# Patient Record
Sex: Female | Born: 1995
Health system: Southern US, Community
[De-identification: ages and names within clinical notes are randomized; demographics above are authoritative.]

## PROBLEM LIST (undated history)

## (undated) ENCOUNTER — Inpatient Hospital Stay (HOSPITAL_COMMUNITY): Payer: Self-pay

## (undated) DIAGNOSIS — Z789 Other specified health status: Secondary | ICD-10-CM

## (undated) DIAGNOSIS — J302 Other seasonal allergic rhinitis: Secondary | ICD-10-CM

## (undated) DIAGNOSIS — K649 Unspecified hemorrhoids: Secondary | ICD-10-CM

## (undated) HISTORY — DX: Unspecified hemorrhoids: K64.9

## (undated) HISTORY — PX: WISDOM TOOTH EXTRACTION: SHX21

---
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
## 2014-05-29 DIAGNOSIS — M2391 Unspecified internal derangement of right knee: Secondary | ICD-10-CM | POA: Insufficient documentation

## 2014-05-29 DIAGNOSIS — M25561 Pain in right knee: Secondary | ICD-10-CM | POA: Insufficient documentation

## 2014-05-29 DIAGNOSIS — M222X9 Patellofemoral disorders, unspecified knee: Secondary | ICD-10-CM | POA: Insufficient documentation

## 2014-08-13 ENCOUNTER — Emergency Department
Admission: EM | Admit: 2014-08-13 | Discharge: 2014-08-13 | Disposition: A | Discharge status: Home / Self Care | Payer: Medicaid Other | Source: Home / Self Care | Attending: Family Medicine | Admitting: Family Medicine

## 2014-08-13 ENCOUNTER — Encounter: Payer: Self-pay | Admitting: *Deleted

## 2014-08-13 DIAGNOSIS — J029 Acute pharyngitis, unspecified: Secondary | ICD-10-CM

## 2014-08-13 DIAGNOSIS — B9789 Other viral agents as the cause of diseases classified elsewhere: Secondary | ICD-10-CM

## 2014-08-13 DIAGNOSIS — J069 Acute upper respiratory infection, unspecified: Secondary | ICD-10-CM

## 2014-08-13 LAB — POCT INFLUENZA A/B
INFLUENZA A, POC: NEGATIVE
Influenza B, POC: NEGATIVE

## 2014-08-13 LAB — POCT RAPID STREP A (OFFICE): RAPID STREP A SCREEN: NEGATIVE

## 2014-08-13 MED ORDER — AZITHROMYCIN 250 MG PO TABS: ORAL_TABLET | ORAL | Status: DC

## 2014-08-13 MED ORDER — BENZONATATE 200 MG PO CAPS: 200.0000 mg | ORAL_CAPSULE | Freq: Every day | ORAL | Status: DC

## 2014-08-13 NOTE — ED Provider Notes (Signed)
CSN: [id number]     Arrival date & time 08/13/14  1248 History   First MD Initiated Contact with Patient 08/13/14 1315     Chief Complaint  Patient presents with  . Generalized Body Aches  . Cough      HPI Comments: Patient developed a sore throat yesterday evening, with fatigue, headache, myalgias, and chills/sweats.  She later developed a cough.  This morning she noted sinus congestion and tightness in her anterior chest.  The history is provided by the patient.    History reviewed. No pertinent past medical history. History reviewed. No pertinent past surgical history. History reviewed. No pertinent family history. History  Substance Use Topics  . Smoking status: Never Smoker   . Smokeless tobacco: Never Used  . Alcohol Use: No   OB History    No data available     Review of Systems + sore throat + hoarseness + cough No pleuritic pain but complains of anterior chest tightness No wheezing + nasal congestion + post-nasal drainage No sinus pain/pressure No itchy/red eyes No earache No hemoptysis No SOB No fever, + chills/sweats No nausea No vomiting No abdominal pain No diarrhea No urinary symptoms No skin rash + fatigue + myalgias + headache Used OTC meds without relief  Allergies  Review of patient's allergies indicates no known allergies.  Home Medications   Prior to Admission medications   Medication Sig Start Date End Date Taking? Authorizing Provider  azithromycin (ZITHROMAX Z-PAK) 250 MG tablet Take 2 tabs today; then begin one tab once daily for 4 more days. (Rx void after 08/21/14) 08/13/14   Lattie HawStephen A Luzmaria Devaux, MD  benzonatate (TESSALON) 200 MG capsule Take 1 capsule (200 mg total) by mouth at bedtime. Take as needed for cough 08/13/14   Lattie HawStephen A Muaz Shorey, MD   BP 113/80 mmHg  Pulse 112  Temp(Src) 99 F (37.2 C) (Oral)  Resp 16  Ht 5\' 1"  (1.549 m)  Wt 127 lb (57.607 kg)  BMI 24.01 kg/m2  SpO2 98%  LMP 07/14/2014 Physical Exam Nursing notes and  Vital Signs reviewed. Appearance:  Patient appears healthy, stated age, and in no acute distress Eyes:  Pupils are equal, round, and reactive to light and accomodation.  Extraocular movement is intact.  Conjunctivae are not inflamed  Ears:  Canals normal.  Tympanic membranes normal.  Nose:  Mildly congested turbinates.  No sinus tenderness.   Pharynx:   Minimal erythema Neck:  Supple.   Tender enlarged anterior/posterior nodes are palpated bilaterally  Lungs:  Clear to auscultation.  Breath sounds are equal.  Heart:  Regular rate and rhythm without murmurs, rubs, or gallops.  Abdomen:  Nontender without masses or hepatosplenomegaly.  Bowel sounds are present.  No CVA or flank tenderness.  Extremities:  No edema.  No calf tenderness Skin:  No rash present.   ED Course  Procedures  None    Labs Reviewed  STREP A DNA PROBE  POCT INFLUENZA A/B negative  POCT RAPID STREP A (OFFICE) negative     MDM   1. Acute pharyngitis, unspecified pharyngitis type   2. Viral URI with cough    There is no evidence of bacterial infection today.  Throat culture pending. Prescription written for Benzonatate [facility](Tessalon) to take at bedtime for night-time cough.   Take plain Mucinex (1200 mg guaifenesin) twice daily for cough and congestion.  May add Sudafed for sinus congestion.   Increase fluid intake, rest. May use Afrin nasal spray (or generic oxymetazoline) twice daily  for about 5 days.  Also recommend using saline nasal spray several times daily and saline nasal irrigation (AYR is a common brand) Try warm salt water gargles for sore throat.  Stop all antihistamines for now, and other non-prescription cough/cold preparations. May take Ibuprofen 200mg , 4 tabs every 8 hours with food for sore throat, fever, etc. Begin Azithromycin if not improving about one week or if persistent fever develops (Given a prescription to hold, with an expiration date)  Follow-up with family doctor if not improving about10  days.    Lattie HawStephen A Tysean Vandervliet, MD 08/16/14 [phone]

## 2014-08-13 NOTE — Discharge Instructions (Signed)
Take plain Mucinex (1200 mg guaifenesin) twice daily for cough and congestion.  May add Sudafed for sinus congestion.   Increase fluid intake, rest. May use Afrin nasal spray (or generic oxymetazoline) twice daily for about 5 days.  Also recommend using saline nasal spray several times daily and saline nasal irrigation (AYR is a common brand) Try warm salt water gargles for sore throat.  Stop all antihistamines for now, and other non-prescription cough/cold preparations. May take Ibuprofen 200mg , 4 tabs every 8 hours with food for sore throat, fever, etc. Begin Azithromycin if not improving about one week or if persistent fever develops (Given a prescription to hold, with an expiration date)  Follow-up with family doctor if not improving about10 days.

## 2014-08-13 NOTE — ED Notes (Signed)
Patricia Cole c/o rapid onset body aches, dry cough, sweats and HA x yesterday. Rec'd flu vac this season.

## 2014-08-14 LAB — STREP A DNA PROBE: GASP: NEGATIVE

## 2014-08-15 ENCOUNTER — Telehealth: Payer: Self-pay | Admitting: Emergency Medicine

## 2015-01-03 ENCOUNTER — Emergency Department
Admission: EM | Admit: 2015-01-03 | Discharge: 2015-01-03 | Disposition: A | Discharge status: Home / Self Care | Payer: Medicaid Other | Source: Home / Self Care | Attending: Family Medicine | Admitting: Family Medicine

## 2015-01-03 ENCOUNTER — Encounter: Payer: Self-pay | Admitting: Emergency Medicine

## 2015-01-03 DIAGNOSIS — J069 Acute upper respiratory infection, unspecified: Secondary | ICD-10-CM

## 2015-01-03 DIAGNOSIS — B9789 Other viral agents as the cause of diseases classified elsewhere: Principal | ICD-10-CM

## 2015-01-03 HISTORY — DX: Other seasonal allergic rhinitis: J30.2

## 2015-01-03 LAB — POCT RAPID STREP A (OFFICE): Rapid Strep A Screen: NEGATIVE

## 2015-01-03 MED ORDER — AZITHROMYCIN 250 MG PO TABS: ORAL_TABLET | ORAL | Status: DC

## 2015-01-03 MED ORDER — BENZONATATE 200 MG PO CAPS: 200.0000 mg | ORAL_CAPSULE | Freq: Every day | ORAL | Status: DC

## 2015-01-03 NOTE — Discharge Instructions (Signed)
Take plain guaifenesin (1200mg  extended release tabs such as Mucinex) twice daily, with plenty of water, for cough and congestion.  May add Pseudoephedrine (30mg , one or two every 4 to 6 hours) for sinus congestion.  Get adequate rest.   May use Afrin nasal spray (or generic oxymetazoline) twice daily for about 5 days.  Also recommend using saline nasal spray several times daily and saline nasal irrigation (AYR is a common brand).  Try warm salt water gargles for sore throat.  Stop all antihistamines for now, and other non-prescription cough/cold preparations. May take Ibuprofen 200mg , 4 tabs every 8 hours with food for body aches, headache, etc. Begin Azithromycin if not improving about one week or if persistent fever develops   Follow-up with family doctor if not improving about10 days.

## 2015-01-03 NOTE — ED Provider Notes (Signed)
CSN: [id number]     Arrival date & time 01/03/15  1033 History   First MD Initiated Contact with Patient 01/03/15 1053     Chief Complaint  Patient presents with  . Facial Pain      HPI Comments: Patient complains of three day history of typical cold-like symptoms including mild sore throat, sinus congestion, headache, fatigue, and cough.   The history is provided by the patient.    Past Medical History  Diagnosis Date  . Seasonal allergies    History reviewed. No pertinent past surgical history. History reviewed. No pertinent family history. History  Substance Use Topics  . Smoking status: Never Smoker   . Smokeless tobacco: Never Used  . Alcohol Use: No   OB History    No data available     Review of Systems + sore throat + hoarse + cough No pleuritic pain No wheezing + nasal congestion + post-nasal drainage No sinus pain/pressure No itchy/red eyes No earache No hemoptysis No SOB No fever, + chills/night sweats No nausea No vomiting No abdominal pain No diarrhea No urinary symptoms No skin rash + fatigue + myalgias + headache Used OTC meds without relief  Allergies  Review of patient's allergies indicates no known allergies.  Home Medications   Prior to Admission medications   Medication Sig Start Date End Date Taking? Authorizing Provider  azithromycin (ZITHROMAX Z-PAK) 250 MG tablet Take 2 tabs today; then begin one tab once daily for 4 more days. (Rx void after 01/11/15) 01/03/15   Lattie HawStephen A Ben Habermann, MD  benzonatate (TESSALON) 200 MG capsule Take 1 capsule (200 mg total) by mouth at bedtime. Take as needed for cough 01/03/15   Lattie HawStephen A Mayzee Reichenbach, MD   BP 111/73 mmHg  Pulse 97  Temp(Src) 98.4 F (36.9 C) (Oral)  Resp 16  Ht 5\' 1"  (1.549 m)  Wt 124 lb (56.246 kg)  BMI 23.44 kg/m2  SpO2 99%  LMP 12/14/2014 (Exact Date) Physical Exam Nursing notes and Vital Signs reviewed. Appearance:  Patient appears stated age, and in no acute distress Eyes:   Pupils are equal, round, and reactive to light and accomodation.  Extraocular movement is intact.  Conjunctivae are not inflamed  Ears:  Canals normal.  Tympanic membranes normal.  Nose:  Mildly congested turbinates.  No sinus tenderness.  Pharynx:  Minimal erythema Neck:  Supple. Tender enlarged posterior nodes are palpated bilaterally  Lungs:  Clear to auscultation.  Breath sounds are equal.  Heart:  Regular rate and rhythm without murmurs, rubs, or gallops.  Abdomen:  Nontender without masses or hepatosplenomegaly.  Bowel sounds are present.  No CVA or flank tenderness.  Extremities:  No edema.  No calf tenderness Skin:  No rash present.   ED Course  Procedures none  Labs Reviewed  POCT RAPID STREP A (OFFICE) negative         MDM   1. Viral URI with cough    There is no evidence of bacterial infection today.  Treat symptomatically for now  Prescription written for Benzonatate (Tessalon) to take at bedtime for night-time cough.  Take plain guaifenesin (1200mg  extended release tabs such as Mucinex) twice daily, with plenty of water, for cough and congestion.  May add Pseudoephedrine (30mg , one or two every 4 to 6 hours) for sinus congestion.  Get adequate rest.   May use Afrin nasal spray (or generic oxymetazoline) twice daily for about 5 days.  Also recommend using saline nasal spray several times daily and saline  nasal irrigation (AYR is a common brand).  Try warm salt water gargles for sore throat.  Stop all antihistamines for now, and other non-prescription cough/cold preparations. May take Ibuprofen , 4 tabs every 8 hours with food for body aches, headache, etc. Begin Azithromycin if not improving about one week or if persistent fever develops (Given a prescription to hold, with an expiration date)  Follow-up with family doctor if not improving about10 days.    Lattie Haw, MD 01/08/15 [phone]

## 2015-01-03 NOTE — ED Notes (Signed)
Reports congestion with headache and pain across facial sinuses; minor sore throat and hoarseness. No OTCs today.

## 2015-12-16 DIAGNOSIS — B009 Herpesviral infection, unspecified: Secondary | ICD-10-CM | POA: Insufficient documentation

## 2017-08-30 DIAGNOSIS — Z3401 Encounter for supervision of normal first pregnancy, first trimester: Secondary | ICD-10-CM | POA: Diagnosis not present

## 2017-08-30 LAB — OB RESULTS CONSOLE ABO/RH: RH Type: POSITIVE

## 2017-08-30 LAB — OB RESULTS CONSOLE RUBELLA ANTIBODY, IGM: Rubella: IMMUNE

## 2017-08-30 LAB — OB RESULTS CONSOLE ANTIBODY SCREEN: ANTIBODY SCREEN: NEGATIVE

## 2017-08-30 LAB — OB RESULTS CONSOLE RPR: RPR: NONREACTIVE

## 2017-08-30 LAB — OB RESULTS CONSOLE HEPATITIS B SURFACE ANTIGEN: Hepatitis B Surface Ag: NEGATIVE

## 2017-08-30 LAB — OB RESULTS CONSOLE HIV ANTIBODY (ROUTINE TESTING): HIV: NONREACTIVE

## 2017-09-01 DIAGNOSIS — Z3401 Encounter for supervision of normal first pregnancy, first trimester: Secondary | ICD-10-CM | POA: Diagnosis not present

## 2017-09-01 DIAGNOSIS — O26899 Other specified pregnancy related conditions, unspecified trimester: Secondary | ICD-10-CM | POA: Diagnosis not present

## 2017-09-12 NOTE — L&D Delivery Note (Addendum)
Patient is 22 y.o. G1P0 4672w1d who delivered a viable newborn female via NSVD at [id number] on 04/23/18. Head delivered OA. Apgars 8/9. No nuchal cord present. Shoulder and body delivered in usual fashion. Infant with spontaneous cry, placed on mother's abdomen, dried and bulb suctioned. Cord clamped x 2 after 1-minute delay, and cut by family member. Cord blood drawn. Placenta delivered spontaneously with gentle cord traction. Fundus firm with massage and IV Pitocin. Perineum inspected and found to have 2nd degree laceration, which was repaired with 3-0 vicryl rapide.   Delivery information: Delivery Note At 11:28 PM a viable female was delivered via Vaginal, Spontaneous (Presentation:OA).  APGAR: 8, 9; weight 7 lb 10.6 oz (3476 g).   Placenta status: spontaneous; cord: 3V, likely villamentous  Anesthesia: epidural  Episiotomy: None Lacerations: 2nd degree Suture Repair: 3.0 vicryl rapide Est. Blood Loss (mL): 200  Mom to postpartum.  Baby to Couplet care / Skin to Skin.  Candis Schatzatricia Dell 04/24/2018, 1:05 AM  Midwife attestation: I was gloved and present for delivery in its entirety and I agree with the above resident's note.  Donette LarryMelanie Ty Buntrock, CNM 1:40 AM

## 2017-09-26 DIAGNOSIS — Z3401 Encounter for supervision of normal first pregnancy, first trimester: Secondary | ICD-10-CM | POA: Diagnosis not present

## 2017-10-13 ENCOUNTER — Emergency Department (HOSPITAL_BASED_OUTPATIENT_CLINIC_OR_DEPARTMENT_OTHER)
Admission: EM | Admit: 2017-10-13 | Discharge: 2017-10-13 | Disposition: A | Discharge status: Home / Self Care | Payer: 59 | Attending: Emergency Medicine | Admitting: Emergency Medicine

## 2017-10-13 ENCOUNTER — Encounter (HOSPITAL_BASED_OUTPATIENT_CLINIC_OR_DEPARTMENT_OTHER): Payer: Self-pay | Admitting: Emergency Medicine

## 2017-10-13 ENCOUNTER — Other Ambulatory Visit: Payer: Self-pay

## 2017-10-13 DIAGNOSIS — R61 Generalized hyperhidrosis: Secondary | ICD-10-CM | POA: Diagnosis not present

## 2017-10-13 DIAGNOSIS — Z79899 Other long term (current) drug therapy: Secondary | ICD-10-CM | POA: Diagnosis not present

## 2017-10-13 DIAGNOSIS — J3489 Other specified disorders of nose and nasal sinuses: Secondary | ICD-10-CM | POA: Insufficient documentation

## 2017-10-13 DIAGNOSIS — R070 Pain in throat: Secondary | ICD-10-CM | POA: Diagnosis not present

## 2017-10-13 DIAGNOSIS — R0981 Nasal congestion: Secondary | ICD-10-CM | POA: Diagnosis not present

## 2017-10-13 DIAGNOSIS — J069 Acute upper respiratory infection, unspecified: Secondary | ICD-10-CM | POA: Insufficient documentation

## 2017-10-13 DIAGNOSIS — R05 Cough: Secondary | ICD-10-CM | POA: Diagnosis not present

## 2017-10-13 DIAGNOSIS — B9789 Other viral agents as the cause of diseases classified elsewhere: Secondary | ICD-10-CM | POA: Insufficient documentation

## 2017-10-13 NOTE — Discharge Instructions (Signed)
Can use robitussin for cold symptoms and tylenol as needed.  Also can use sinus rinse or netty pot

## 2017-10-13 NOTE — ED Provider Notes (Signed)
[facility] EMERGENCY DEPARTMENT Provider Note   CSN: [id number] Arrival date & time: 10/13/17  1542     History   Chief Complaint Chief Complaint  Patient presents with  . Cough    HPI Patricia Cole is a 22 y.o. female.  The history is provided by the patient.  URI   This is a new problem. Episode onset: 3 days ago. The problem has not changed since onset.There has been no fever. Associated symptoms include congestion, rhinorrhea, sinus pain, sore throat and cough. Pertinent negatives include no rash and no wheezing. Associated symptoms comments: sweats. Treatments tried: tylenol. The treatment provided no relief.    Past Medical History:  Diagnosis Date  . Seasonal allergies     Patient Active Problem List   Diagnosis Date Noted  . URI 09/20/2009  . FEVER UNSPECIFIED 09/20/2009  . COUGH 09/20/2009  . CONTUSION OF BACK 05/21/2009  . Acute pharyngitis 03/29/2009    History reviewed. No pertinent surgical history.  OB History    Gravida Para Term Preterm AB Living   1             SAB TAB Ectopic Multiple Live Births                   Home Medications    Prior to Admission medications   Medication Sig Start Date End Date Taking? Authorizing Provider  ondansetron (ZOFRAN) 4 MG tablet Take 4 mg by mouth every 8 (eight) hours as needed for nausea or vomiting.   Yes [provider]  Prenatal Vit-Fe Fumarate-FA (PRENATAL MULTIVITAMIN) TABS tablet Take 1 tablet by mouth daily at 12 noon.   Yes [provider]  azithromycin (ZITHROMAX Z-PAK) 250 MG tablet Take 2 tabs today; then begin one tab once daily for 4 more days. (Rx void after 01/11/15) 01/03/15   Lattie Haw, MD  benzonatate (TESSALON) 200 MG capsule Take 1 capsule (200 mg total) by mouth at bedtime. Take as needed for cough 01/03/15   Lattie Haw, MD    Family History History reviewed. No pertinent family history.  Social History Social History   Tobacco Use  .  Smoking status: Never Smoker  . Smokeless tobacco: Never Used  Substance Use Topics  . Alcohol use: No  . Drug use: No     Allergies   Patient has no known allergies.   Review of Systems Review of Systems  HENT: Positive for congestion, rhinorrhea, sinus pain and sore throat.   Respiratory: Positive for cough. Negative for wheezing.   Genitourinary:       Currently [redacted] weeks pregnant and still having some n/v but unchanged  Skin: Negative for rash.  All other systems reviewed and are negative.    Physical Exam Updated Vital Signs BP 132/79 (BP Location: Left Arm)   Pulse 96   Temp 98 F (36.7 C) (Oral)   Resp 18   Ht 5\' 1"  (1.549 m)   Wt 61.2 kg (135 lb)   SpO2 100%   BMI 25.51 kg/m   Physical Exam  Constitutional: She is oriented to person, place, and time. She appears well-developed and well-nourished. No distress.  HENT:  Head: Normocephalic and atraumatic.  Nose: Mucosal edema and rhinorrhea present.  Mouth/Throat: Posterior oropharyngeal erythema present. No oropharyngeal exudate or posterior oropharyngeal edema.  Bilateral cerumen impaction  Eyes: Conjunctivae and EOM are normal. Pupils are equal, round, and reactive to light.  Neck: Normal range of motion. Neck supple.  Cardiovascular: Normal rate, regular rhythm and intact distal pulses.  No murmur heard. Pulmonary/Chest: Effort normal and breath sounds normal. No respiratory distress. She has no wheezes. She has no rales.  Musculoskeletal: Normal range of motion. She exhibits no edema or tenderness.  Neurological: She is alert and oriented to person, place, and time.  Skin: Skin is warm and dry. No rash noted. No erythema.  Psychiatric: She has a normal mood and affect. Her behavior is normal.  Nursing note and vitals reviewed.    ED Treatments / Results  Labs (all labs ordered are listed, but only abnormal results are displayed) Labs Reviewed - No data to display  EKG  EKG Interpretation None         Radiology No results found.  Procedures Procedures (including critical care time)  Medications Ordered in ED Medications - No data to display   Initial Impression / Assessment and Plan / ED Course  I have reviewed the triage vital signs and the nursing notes.  Pertinent labs & imaging results that were available during my care of the patient were reviewed by me and considered in my medical decision making (see chart for details).     Pt with symptoms consistent with viral URI.  Well appearing here.  No signs of breathing difficulty  No signs of pharyngitis, otitis or abnormal abdominal findings.   Supportive care and pt to return with any further problems.   Final Clinical Impressions(s) / ED Diagnoses   Final diagnoses:  Viral URI with cough    ED Discharge Orders    None       Gwyneth SproutPlunkett, Joscelyne Renville, MD 10/13/17 1746

## 2017-10-13 NOTE — ED Triage Notes (Signed)
Patient reports " I want to make sure I don't have the flu".  Reports sore throat, "feeling sweaty", cough, body aches x 3 days.  Denies fever

## 2017-10-13 NOTE — ED Notes (Signed)
ED Provider at bedside. 

## 2017-10-16 ENCOUNTER — Other Ambulatory Visit: Payer: Self-pay

## 2017-10-16 ENCOUNTER — Emergency Department (INDEPENDENT_AMBULATORY_CARE_PROVIDER_SITE_OTHER)
Admission: EM | Admit: 2017-10-16 | Discharge: 2017-10-16 | Disposition: A | Discharge status: Home / Self Care | Payer: 59 | Source: Home / Self Care | Attending: Family Medicine | Admitting: Family Medicine

## 2017-10-16 DIAGNOSIS — O219 Vomiting of pregnancy, unspecified: Secondary | ICD-10-CM

## 2017-10-16 DIAGNOSIS — J029 Acute pharyngitis, unspecified: Secondary | ICD-10-CM

## 2017-10-16 DIAGNOSIS — J069 Acute upper respiratory infection, unspecified: Secondary | ICD-10-CM | POA: Diagnosis not present

## 2017-10-16 MED ORDER — AMOXICILLIN 500 MG PO CAPS: 500.0000 mg | ORAL_CAPSULE | Freq: Three times a day (TID) | ORAL | 0 refills | Status: DC

## 2017-10-16 NOTE — ED Provider Notes (Signed)
[facility]    CSN: [id number] Arrival date & time: 10/16/17  1347     History   Chief Complaint Chief Complaint  Patient presents with  . Cough  . Sore Throat  . Fever  . Nasal Congestion    HPI Patricia Cole is a 22 y.o. female.   HPI Patricia Cole is a 22 y.o. female presenting to UC with c/o 1 week of worsening cough, congestion, HA, fatigue, nausea vomiting and low grade fever.  She was [facility] at [facility] ago and was advised symptoms were viral in nature. She has been taking Tylenol every 4 hours with mild temporary relief.  Denies chest pain or SOB.  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  Pt is [redacted] weeks pregnant but states that because occasional nausea and vomiting, her pregnancy is going well. She did try Zofran as prescribed by her OB/GYN but no relief.    Past Medical History:  Diagnosis Date  . Seasonal allergies     Patient Active Problem List   Diagnosis Date Noted  . URI 09/20/2009  . FEVER UNSPECIFIED 09/20/2009  . COUGH 09/20/2009  . CONTUSION OF BACK 05/21/2009  . Acute pharyngitis 03/29/2009    History reviewed. No pertinent surgical history.  OB History    Gravida Para Term Preterm AB Living   1             SAB TAB Ectopic Multiple Live Births                   Home Medications    Prior to Admission medications   Medication Sig Start Date End Date Taking? Authorizing Provider  amoxicillin (AMOXIL) 500 MG capsule Take 1 capsule (500 mg total) by mouth 3 (three) times daily. 10/16/17   Lurene ShadowPhelps, Derisha Funderburke O, PA-C  azithromycin (ZITHROMAX Z-PAK) 250 MG tablet Take 2 tabs today; then begin one tab once daily for 4 more days. (Rx void after 01/11/15) 01/03/15   Lattie HawBeese, Stephen A, MD  benzonatate (TESSALON) 200 MG capsule Take 1 capsule (200 mg total) by mouth at bedtime. Take as needed for cough 01/03/15   Lattie HawBeese, Stephen A, MD  ondansetron (ZOFRAN) 4 MG tablet Take 4 mg by mouth every 8 (eight) hours as  needed for nausea or vomiting.    [provider]  Prenatal Vit-Fe Fumarate-FA (PRENATAL MULTIVITAMIN) TABS tablet Take 1 tablet by mouth daily at 12 noon.    [provider]    Family History History reviewed. No pertinent family history.  Social History Social History   Tobacco Use  . Smoking status: Never Smoker  . Smokeless tobacco: Never Used  Substance Use Topics  . Alcohol use: No  . Drug use: No     Allergies   Patient has no known allergies.   Review of Systems Review of Systems  Constitutional: Positive for chills and fever.  HENT: Positive for congestion and sore throat. Negative for ear pain, trouble swallowing and voice change.   Respiratory: Positive for cough. Negative for shortness of breath.   Cardiovascular: Negative for chest pain and palpitations.  Gastrointestinal: Positive for nausea and vomiting. Negative for abdominal pain and diarrhea.  Musculoskeletal: Negative for arthralgias, back pain and myalgias.  Skin: Negative for rash.  Neurological: Negative for dizziness and light-headedness.     Physical Exam Triage Vital Signs ED Triage Vitals  Enc Vitals Group     BP 10/16/17 1410  119/82     Pulse Rate 10/16/17 1410 95     Resp --      Temp 10/16/17 1410 98.1 F (36.7 C)     Temp Source 10/16/17 1410 Oral     SpO2 10/16/17 1410 98 %     Weight 10/16/17 1411 133 lb (60.3 kg)     Height 10/16/17 1411 5\' 1"  (1.549 m)     Head Circumference --      Peak Flow --      Pain Score 10/16/17 1411 0     Pain Loc --      Pain Edu? --      Excl. in GC? --    No data found.  Updated Vital Signs BP 119/82 (BP Location: Right Arm)   Pulse 95   Temp 98.1 F (36.7 C) (Oral)   Ht 5\' 1"  (1.549 m)   Wt 133 lb (60.3 kg)   SpO2 98%   BMI 25.13 kg/m   Visual Acuity Right Eye Distance:   Left Eye Distance:   Bilateral Distance:    Right Eye Near:   Left Eye Near:    Bilateral Near:     Physical Exam  Constitutional: She is  oriented to person, place, and time. She appears well-developed and well-nourished.  Non-toxic appearance. She does not appear ill. No distress.  HENT:  Head: Normocephalic and atraumatic.  Right Ear: Tympanic membrane normal.  Left Ear: Tympanic membrane normal.  Nose: Mucosal edema present. Right sinus exhibits no maxillary sinus tenderness and no frontal sinus tenderness. Left sinus exhibits no maxillary sinus tenderness and no frontal sinus tenderness.  Mouth/Throat: Uvula is midline and mucous membranes are normal. Posterior oropharyngeal erythema present. No oropharyngeal exudate, posterior oropharyngeal edema or tonsillar abscesses.  Eyes: EOM are normal.  Neck: Normal range of motion. Neck supple. No thyromegaly present.  Cardiovascular: Normal rate.  Pulmonary/Chest: Effort normal and breath sounds normal. No stridor. No respiratory distress. She has no wheezes. She has no rhonchi. She has no rales.  Musculoskeletal: Normal range of motion.  Lymphadenopathy:    She has cervical adenopathy.  Neurological: She is alert and oriented to person, place, and time.  Skin: Skin is warm and dry.  Psychiatric: She has a normal mood and affect. Her behavior is normal.  Nursing note and vitals reviewed.    UC Treatments / Results  Labs (all labs ordered are listed, but only abnormal results are displayed) Labs Reviewed - No data to display  EKG  EKG Interpretation None       Radiology No results found.  Procedures Procedures (including critical care time)  Medications Ordered in UC Medications - No data to display   Initial Impression / Assessment and Plan / UC Course  I have reviewed the triage vital signs and the nursing notes.  Pertinent labs & imaging results that were available during my care of the patient were reviewed by me and considered in my medical decision making (see chart for details).     Pt is [redacted] weeks pregnant, worsening URI symptoms with low-grade  fever Will cover for potential underlying bacterial infection Home care instructions provided F/u with PCP in 1 week if not improving, sooner if worsening.  Continue f/u with OB/GYN as previously scheduled.   Final Clinical Impressions(s) / UC Diagnoses   Final diagnoses:  Upper respiratory tract infection, unspecified type  Nausea and vomiting in pregnancy  Pharyngitis, unspecified etiology    ED Discharge Orders  Ordered    amoxicillin (AMOXIL) 500 MG capsule  3 times daily     10/16/17 1456       Controlled Substance Prescriptions [place] Controlled Substance Registry consulted? Not Applicable   Rolla Plate 10/16/17 1610

## 2017-10-16 NOTE — ED Triage Notes (Signed)
Started last Monday with sore throat and congestion.  Cough and fever started Thursday, went to the ER Friday.  Still not feeling well

## 2017-10-16 NOTE — Discharge Instructions (Signed)
°  You may take 500mg  acetaminophen every 4-6 hours.  You may also take over the counter benadryl to help with nausea.   Saltwater gargles or rinses as well as steam showers or humidifier can help with sore throat and congestion.   Be sure to drink at least eight 8oz glasses of water to stay well hydrated and get at least 8 hours of sleep at night, preferably more while sick.   Please take antibiotics as prescribed and be sure to complete entire course even if you start to feel better to ensure infection does not come back. Unless directed otherwise by your OB/GYN or PCP.

## 2017-10-19 ENCOUNTER — Telehealth: Payer: Self-pay

## 2017-10-19 NOTE — Telephone Encounter (Signed)
Pt called and was requesting an extension of a work note.  I tried to call pt back x2, and call would not go through.

## 2017-11-02 DIAGNOSIS — Z3A16 16 weeks gestation of pregnancy: Secondary | ICD-10-CM | POA: Diagnosis not present

## 2017-11-02 DIAGNOSIS — R69 Illness, unspecified: Secondary | ICD-10-CM | POA: Diagnosis not present

## 2017-11-02 DIAGNOSIS — M791 Myalgia, unspecified site: Secondary | ICD-10-CM | POA: Diagnosis not present

## 2017-11-02 DIAGNOSIS — J029 Acute pharyngitis, unspecified: Secondary | ICD-10-CM | POA: Diagnosis not present

## 2017-11-06 ENCOUNTER — Inpatient Hospital Stay (HOSPITAL_COMMUNITY): Payer: 59

## 2017-11-06 ENCOUNTER — Encounter (HOSPITAL_COMMUNITY): Payer: Self-pay | Admitting: *Deleted

## 2017-11-06 ENCOUNTER — Inpatient Hospital Stay (HOSPITAL_COMMUNITY)
Admission: AD | Admit: 2017-11-06 | Discharge: 2017-11-06 | Disposition: A | Discharge status: Home / Self Care | Payer: 59 | Source: Ambulatory Visit | Attending: Obstetrics and Gynecology | Admitting: Obstetrics and Gynecology

## 2017-11-06 ENCOUNTER — Other Ambulatory Visit: Payer: Self-pay

## 2017-11-06 DIAGNOSIS — O26892 Other specified pregnancy related conditions, second trimester: Secondary | ICD-10-CM | POA: Insufficient documentation

## 2017-11-06 DIAGNOSIS — Z3A16 16 weeks gestation of pregnancy: Secondary | ICD-10-CM | POA: Diagnosis not present

## 2017-11-06 DIAGNOSIS — O219 Vomiting of pregnancy, unspecified: Secondary | ICD-10-CM | POA: Diagnosis not present

## 2017-11-06 DIAGNOSIS — J029 Acute pharyngitis, unspecified: Secondary | ICD-10-CM | POA: Insufficient documentation

## 2017-11-06 DIAGNOSIS — Z79899 Other long term (current) drug therapy: Secondary | ICD-10-CM | POA: Diagnosis not present

## 2017-11-06 DIAGNOSIS — O99282 Endocrine, nutritional and metabolic diseases complicating pregnancy, second trimester: Secondary | ICD-10-CM | POA: Diagnosis not present

## 2017-11-06 DIAGNOSIS — R69 Illness, unspecified: Secondary | ICD-10-CM | POA: Diagnosis not present

## 2017-11-06 DIAGNOSIS — J0101 Acute recurrent maxillary sinusitis: Secondary | ICD-10-CM | POA: Diagnosis not present

## 2017-11-06 DIAGNOSIS — R05 Cough: Secondary | ICD-10-CM | POA: Diagnosis not present

## 2017-11-06 DIAGNOSIS — E860 Dehydration: Secondary | ICD-10-CM | POA: Diagnosis not present

## 2017-11-06 DIAGNOSIS — Z5321 Procedure and treatment not carried out due to patient leaving prior to being seen by health care provider: Secondary | ICD-10-CM | POA: Diagnosis not present

## 2017-11-06 DIAGNOSIS — Z3490 Encounter for supervision of normal pregnancy, unspecified, unspecified trimester: Secondary | ICD-10-CM | POA: Diagnosis not present

## 2017-11-06 DIAGNOSIS — R6889 Other general symptoms and signs: Secondary | ICD-10-CM

## 2017-11-06 DIAGNOSIS — O9989 Other specified diseases and conditions complicating pregnancy, childbirth and the puerperium: Secondary | ICD-10-CM

## 2017-11-06 DIAGNOSIS — R112 Nausea with vomiting, unspecified: Secondary | ICD-10-CM | POA: Diagnosis not present

## 2017-11-06 DIAGNOSIS — R059 Cough, unspecified: Secondary | ICD-10-CM

## 2017-11-06 DIAGNOSIS — R0989 Other specified symptoms and signs involving the circulatory and respiratory systems: Secondary | ICD-10-CM | POA: Diagnosis not present

## 2017-11-06 DIAGNOSIS — R634 Abnormal weight loss: Secondary | ICD-10-CM | POA: Diagnosis not present

## 2017-11-06 DIAGNOSIS — O368320 Maternal care for abnormalities of the fetal heart rate or rhythm, second trimester, not applicable or unspecified: Secondary | ICD-10-CM | POA: Diagnosis not present

## 2017-11-06 HISTORY — DX: Other specified health status: Z78.9

## 2017-11-06 LAB — CBC WITH DIFFERENTIAL/PLATELET
Basophils Absolute: 0 10*3/uL (ref 0.0–0.1)
Basophils Relative: 0 %
Eosinophils Absolute: 0.2 10*3/uL (ref 0.0–0.7)
Eosinophils Relative: 2 %
HEMATOCRIT: 38.3 % (ref 36.0–46.0)
HEMOGLOBIN: 12.9 g/dL (ref 12.0–15.0)
LYMPHS PCT: 26 %
Lymphs Abs: 3 10*3/uL (ref 0.7–4.0)
MCH: 29.8 pg (ref 26.0–34.0)
MCHC: 33.7 g/dL (ref 30.0–36.0)
MCV: 88.5 fL (ref 78.0–100.0)
MONOS PCT: 3 %
Monocytes Absolute: 0.4 10*3/uL (ref 0.1–1.0)
NEUTROS ABS: 7.9 10*3/uL — AB (ref 1.7–7.7)
NEUTROS PCT: 69 %
Platelets: 309 10*3/uL (ref 150–400)
RBC: 4.33 MIL/uL (ref 3.87–5.11)
RDW: 12.9 % (ref 11.5–15.5)
WBC: 11.5 10*3/uL — ABNORMAL HIGH (ref 4.0–10.5)

## 2017-11-06 LAB — URINALYSIS, ROUTINE W REFLEX MICROSCOPIC
Bilirubin Urine: NEGATIVE
GLUCOSE, UA: 50 mg/dL — AB
HGB URINE DIPSTICK: NEGATIVE
Ketones, ur: 20 mg/dL — AB
LEUKOCYTES UA: NEGATIVE
NITRITE: NEGATIVE
PH: 5 (ref 5.0–8.0)
Protein, ur: 30 mg/dL — AB
SPECIFIC GRAVITY, URINE: 1.027 (ref 1.005–1.030)

## 2017-11-06 LAB — INFLUENZA PANEL BY PCR (TYPE A & B)
INFLAPCR: NEGATIVE
Influenza B By PCR: NEGATIVE

## 2017-11-06 MED ORDER — DEXTROSE 5 % IN LACTATED RINGERS IV BOLUS
1000.0000 mL | Freq: Once | INTRAVENOUS | Status: AC
  Administered 2017-11-06: 1000 mL via INTRAVENOUS

## 2017-11-06 MED ORDER — METOCLOPRAMIDE HCL 5 MG/ML IJ SOLN
10.0000 mg | Freq: Once | INTRAMUSCULAR | Status: AC
  Administered 2017-11-06: 10 mg via INTRAVENOUS
  Filled 2017-11-06: qty 2

## 2017-11-06 MED ORDER — ONDANSETRON 8 MG PO TBDP: 8.0000 mg | ORAL_TABLET | Freq: Three times a day (TID) | ORAL | 0 refills | Status: DC | PRN

## 2017-11-06 MED ORDER — GUAIFENESIN-CODEINE 100-10 MG/5ML PO SOLN: 5.0000 mL | ORAL | 0 refills | Status: DC | PRN

## 2017-11-06 MED ORDER — AMOXICILLIN-POT CLAVULANATE ER 1000-62.5 MG PO TB12: 2.0000 | ORAL_TABLET | Freq: Two times a day (BID) | ORAL | 0 refills | Status: AC

## 2017-11-06 MED ORDER — FAMOTIDINE IN NACL 20-0.9 MG/50ML-% IV SOLN
20.0000 mg | Freq: Once | INTRAVENOUS | Status: AC
  Administered 2017-11-06: 20 mg via INTRAVENOUS
  Filled 2017-11-06: qty 50

## 2017-11-06 NOTE — Discharge Instructions (Signed)
Cool Mist Vaporizer A cool mist vaporizer is a device that releases a cool mist into the air. If you have a cough or a cold, using a vaporizer may help relieve your symptoms. The mist adds moisture to the air, which may help thin your mucus and make it less sticky. When your mucus is thin and less sticky, it easier for you to breathe and to cough up secretions. Do not use a vaporizer if you are allergic to mold. Follow these instructions at home:  Follow the instructions that come with the vaporizer.  Do not use anything other than distilled water in the vaporizer.  Do not run the vaporizer all of the time. Doing that can cause mold or bacteria to grow in the vaporizer.  Clean the vaporizer after each time that you use it.  Clean and dry the vaporizer well before storing it.  Stop using the vaporizer if your breathing symptoms get worse. This information is not intended to replace advice given to you by your health care provider. Make sure you discuss any questions you have with your health care provider. Document Released: 05/26/2004 Document Revised: 03/18/2016 Document Reviewed: 11/28/2015 Elsevier Interactive Patient Education  2018 Elsevier Inc.   Sinusitis, Adult Sinusitis is soreness and inflammation of your sinuses. Sinuses are hollow spaces in the bones around your face. They are located:  Around your eyes.  In the middle of your forehead.  Behind your nose.  In your cheekbones.  Your sinuses and nasal passages are lined with a stringy fluid (mucus). Mucus normally drains out of your sinuses. When your nasal tissues get inflamed or swollen, the mucus can get trapped or blocked so air cannot flow through your sinuses. This lets bacteria, viruses, and funguses grow, and that leads to infection. Follow these instructions at home: Medicines  Take, use, or apply over-the-counter and prescription medicines only as told by your doctor. These may include nasal sprays.  If you  were prescribed an antibiotic medicine, take it as told by your doctor. Do not stop taking the antibiotic even if you start to feel better. Hydrate and Humidify  Drink enough water to keep your pee (urine) clear or pale yellow.  Use a cool mist humidifier to keep the humidity level in your home above 50%.  Breathe in steam for 10-15 minutes, 3-4 times a day or as told by your doctor. You can do this in the bathroom while a hot shower is running.  Try not to spend time in cool or dry air. Rest  Rest as much as possible.  Sleep with your head raised (elevated).  Make sure to get enough sleep each night. General instructions  Put a warm, moist washcloth on your face 3-4 times a day or as told by your doctor. This will help with discomfort.  Wash your hands often with soap and water. If there is no soap and water, use hand sanitizer.  Do not smoke. Avoid being around people who are smoking (secondhand smoke).  Keep all follow-up visits as told by your doctor. This is important. Contact a doctor if:  You have a fever.  Your symptoms get worse.  Your symptoms do not get better within 10 days. Get help right away if:  You have a very bad headache.  You cannot stop throwing up (vomiting).  You have pain or swelling around your face or eyes.  You have trouble seeing.  You feel confused.  Your neck is stiff.  You have trouble   breathing. This information is not intended to replace advice given to you by your health care provider. Make sure you discuss any questions you have with your health care provider. Document Released: 02/15/2008 Document Revised: 04/24/2016 Document Reviewed: 06/24/2015 Elsevier Interactive Patient Education  2018 Elsevier Inc.  

## 2017-11-06 NOTE — MAU Provider Note (Signed)
History     CSN: [id number]  Arrival date and time: 11/06/17 1432   First Provider Initiated Contact with Patient 11/06/17 1645      Chief Complaint  Patient presents with  . Emesis  . congested   HPI   Ms.Patricia Cole is a 22 y.o. G1P0 @ [redacted]w[redacted]d here in MAU with flu like symptoms. Says she started having congestion, cough, sore throat. Symptoms started 3 weeks ago, with symptoms improving and then returned last Thursday. She had a low grade temp of 99.0 and was seen in the office and was swabbed for the flu which was negative. They started her on Tamiflu which she is on now.  Says she has been vomiting up mucus, and says she has been battling with N/V the whole pregnancy.  Says she took amoxicillin 3 weeks ago for flu. Says her Dr. Jovita Gamma it to her so she did not get pneumonia. She completed this course of amoxicillin. Patient was seen by her PCP today and was told to come here for possible pneumonia and dehydration.   OB History    Gravida Para Term Preterm AB Living   1             SAB TAB Ectopic Multiple Live Births                  Past Medical History:  Diagnosis Date  . Medical history non-contributory   . Seasonal allergies     Past Surgical History:  Procedure Laterality Date  . WISDOM TOOTH EXTRACTION      History reviewed. No pertinent family history.  Social History   Tobacco Use  . Smoking status: Never Smoker  . Smokeless tobacco: Never Used  Substance Use Topics  . Alcohol use: No  . Drug use: No    Allergies: No Known Allergies  Medications Prior to Admission  Medication Sig Dispense Refill Last Dose  . oseltamivir (TAMIFLU) 75 MG capsule Take 75 mg by mouth 2 (two) times daily.   11/06/2017 at Unknown time  . Prenatal Vit-Fe Fumarate-FA (PRENATAL MULTIVITAMIN) TABS tablet Take 1 tablet by mouth daily at 12 noon.   11/06/2017 at Unknown time  . amoxicillin (AMOXIL) 500 MG capsule Take 1 capsule (500 mg total) by mouth 3 (three) times daily.  (Patient not taking: Reported on 11/06/2017) 21 capsule 0 Completed Course at Unknown time  . ondansetron (ZOFRAN) 4 MG tablet Take 4 mg by mouth every 8 (eight) hours as needed for nausea or vomiting.   11/04/2017   Results for orders placed or performed during the hospital encounter of 11/06/17 (from the past 48 hour(s))  Urinalysis, Routine w reflex microscopic     Status: Abnormal   Collection Time: 11/06/17  2:54 PM  Result Value Ref Range   Color, Urine AMBER (A) YELLOW    Comment: BIOCHEMICALS MAY BE AFFECTED BY COLOR   APPearance CLOUDY (A) CLEAR   Specific Gravity, Urine 1.027 1.005 - 1.030   pH 5.0 5.0 - 8.0   Glucose, UA 50 (A) NEGATIVE mg/dL   Hgb urine dipstick NEGATIVE NEGATIVE   Bilirubin Urine NEGATIVE NEGATIVE   Ketones, ur 20 (A) NEGATIVE mg/dL   Protein, ur 30 (A) NEGATIVE mg/dL   Nitrite NEGATIVE NEGATIVE   Leukocytes, UA NEGATIVE NEGATIVE   RBC / HPF 0-5 0 - 5 RBC/hpf   WBC, UA 0-5 0 - 5 WBC/hpf   Bacteria, UA RARE (A) NONE SEEN   Squamous Epithelial / LPF 6-30 (A)  NONE SEEN   Mucus PRESENT    Ca Oxalate Crys, UA PRESENT    Sperm, UA PRESENT     Comment: Performed at [facility], [address] [place], [place] [address]  Influenza panel by PCR (type A & B)     Status: None   Collection Time: 11/06/17  5:30 PM  Result Value Ref Range   Influenza A By PCR NEGATIVE NEGATIVE   Influenza B By PCR NEGATIVE NEGATIVE    Comment: (NOTE) The Xpert Xpress Flu assay is intended as an aid in the diagnosis of  influenza and should not be used as a sole basis for treatment.  This  assay is FDA approved for nasopharyngeal swab specimens only. Nasal  washings and aspirates are unacceptable for Xpert Xpress Flu testing. Performed at [facility], [address] [place], [place] [address]   CBC with Differential     Status: Abnormal   Collection Time: 11/06/17  5:30 PM  Result Value Ref Range   WBC 11.5 (H) 4.0 - 10.5 K/uL   RBC 4.33 3.87 - 5.11 MIL/uL    Hemoglobin 12.9 12.0 - 15.0 g/dL   HCT 09.838.3 11.936.0 - 14.746.0 %   MCV 88.5 78.0 - 100.0 fL   MCH 29.8 26.0 - 34.0 pg   MCHC 33.7 30.0 - 36.0 g/dL   RDW 82.912.9 56.211.5 - 13.015.5 %   Platelets 309 150 - 400 K/uL   Neutrophils Relative % 69 %   Neutro Abs 7.9 (H) 1.7 - 7.7 K/uL   Lymphocytes Relative 26 %   Lymphs Abs 3.0 0.7 - 4.0 K/uL   Monocytes Relative 3 %   Monocytes Absolute 0.4 0.1 - 1.0 K/uL   Eosinophils Relative 2 %   Eosinophils Absolute 0.2 0.0 - 0.7 K/uL   Basophils Relative 0 %   Basophils Absolute 0.0 0.0 - 0.1 K/uL    Comment: Performed at [facility], [address] [place], [place] [address]   Dg Chest 1 View  Result Date: 11/06/2017 CLINICAL DATA:  Coughing congestion with intermittent fever for 4 days, pregnant EXAM: CHEST 1 VIEW COMPARISON:  09/20/2009 FINDINGS: Normal heart size, mediastinal contours, and pulmonary vascularity. Lungs clear. No pleural effusion or pneumothorax. Bones unremarkable. IMPRESSION: Normal exam. Electronically Signed   By: Ulyses SouthwardMark  Boles M.D.   On: 11/06/2017 17:33    Review of Systems  Constitutional: Positive for appetite change and chills. Negative for fever.  HENT: Positive for congestion, postnasal drip, sinus pressure, sinus pain, sneezing and sore throat. Negative for ear pain.   Respiratory: Positive for cough. Negative for shortness of breath and wheezing.   Genitourinary: Negative for dysuria, frequency, pelvic pain and urgency.   Physical Exam   Blood pressure 109/67, pulse 87, temperature 97.9 F (36.6 C), temperature source Oral, resp. rate 18, weight 138 lb (62.6 kg), SpO2 100 %.  Physical Exam  Constitutional: She is oriented to person, place, and time. She appears well-developed and well-nourished.  Non-toxic appearance. She has a sickly appearance. She appears ill. No distress.  HENT:  Head: Normocephalic.  Right Ear: Hearing, tympanic membrane, external ear and ear canal normal.  Left Ear: Hearing, tympanic membrane,  external ear and ear canal normal.  Nose: Rhinorrhea present. Right sinus exhibits maxillary sinus tenderness and frontal sinus tenderness. Left sinus exhibits maxillary sinus tenderness and frontal sinus tenderness.  Mouth/Throat: Uvula is midline and mucous membranes are normal. No oropharyngeal exudate, posterior oropharyngeal edema, posterior oropharyngeal erythema or tonsillar abscesses.  Eyes:  Pupils are equal, round, and reactive to light.  Neck: Normal range of motion.  Cardiovascular: Normal rate.  Respiratory: Effort normal and breath sounds normal. No respiratory distress. She has no wheezes. She has no rales. She exhibits no tenderness.  Musculoskeletal: Normal range of motion.  Neurological: She is alert and oriented to person, place, and time.  Skin: Skin is warm. She is not diaphoretic.  Psychiatric: Her behavior is normal.    MAU Course  Procedures  None  MDM  + fetal heart tones via doppler  Chest xray normal  CBC and influenza swab Patient sickly appearing with normal chest xray. Symptoms improved following antibiotic treatment then shortly returned; likely secondary to treatment failure. Will treat again for acute sinusitis with a more aggressive regimen. Discussed with Dr. Richardson Dopp who is agreeable to the plan of care. LR bolus, pepcid and reglan given. Patient feeling much better.  Assessment and Plan   A:  1. Acute recurrent maxillary sinusitis   2. Cough   3. Flu-like symptoms     P:  Discharge home with strict return precautions Rx: Augmentin 2 grams BID, Codeine cough, and Zofran 8 mg ODT  Return to MAU if symptoms worsen Increase rest and fluids Follow up with Ob as scheduled    Duane Lope, NP 11/07/2017 11:34 AM

## 2017-11-06 NOTE — MAU Note (Signed)
Went to her dr, she sent her here because she was dehydrated. Checked the babies heart rate, was 130's. Has always been in the 160's.  Hasn't really been able to keep anything down. Thinks she might have pneumonia, has been on tamiflu and isn't feeling any better.  Had lost 5# between visits. Low grade fever 99. Productive cough- sinus drainage.

## 2018-01-15 ENCOUNTER — Encounter: Payer: Self-pay | Admitting: Advanced Practice Midwife

## 2018-01-15 ENCOUNTER — Ambulatory Visit (INDEPENDENT_AMBULATORY_CARE_PROVIDER_SITE_OTHER): Payer: Medicaid Other | Admitting: Advanced Practice Midwife

## 2018-01-15 VITALS — BP 118/75 | HR 94 | Wt 156.0 lb

## 2018-01-15 DIAGNOSIS — Z3402 Encounter for supervision of normal first pregnancy, second trimester: Secondary | ICD-10-CM

## 2018-01-15 DIAGNOSIS — Z3480 Encounter for supervision of other normal pregnancy, unspecified trimester: Secondary | ICD-10-CM

## 2018-01-15 NOTE — Progress Notes (Signed)
Pt states she had recent pap smear with normal results but I do not see a pap in the records for [facility]

## 2018-01-15 NOTE — Patient Instructions (Signed)
Glucose Tolerance Test During Pregnancy The glucose tolerance test (GTT) is a blood test used to determine if you have developed a type of diabetes during pregnancy (gestational diabetes). This is when your body does not properly process sugar (glucose) in the food you eat, resulting in high blood glucose levels. Typically, a GTT is done after you have had a 1-hour glucose test with results that indicate you possibly have gestational diabetes. It may also be done if:  You have a history of giving birth to very large babies or have experienced repeated fetal loss (stillbirth).  You have signs and symptoms of diabetes, such as: ? Changes in your vision. ? Tingling or numbness in your hands or feet. ? Changes in hunger, thirst, and urination not otherwise explained by your pregnancy.  The GTT lasts about 3 hours. You will be given a sugar-water solution to drink at the beginning of the test. You will have blood drawn before you drink the solution and then again 1, 2, and 3 hours after you drink it. You will not be allowed to eat or drink anything else during the test. You must remain at the testing location to make sure that your blood is drawn on time. You should also avoid exercising during the test, because exercise can alter test results. How do I prepare for this test? Eat normally for 3 days prior to the GTT test, including having plenty of carbohydrate-rich foods. Do not eat or drink anything except water during the final 12 hours before the test. In addition, your health care provider may ask you to stop taking certain medicines before the test. What do the results mean? It is your responsibility to obtain your test results. Ask the lab or department performing the test when and how you will get your results. Contact your health care provider to discuss any questions you have about your results. Range of Normal Values Ranges for normal values may vary among different labs and hospitals. You  should always check with your health care provider after having lab work or other tests done to discuss whether your values are considered within normal limits. Normal levels of blood glucose are as follows:  Fasting: less than 105 mg/dL.  1 hour after drinking the solution: less than 190 mg/dL.  2 hours after drinking the solution: less than 165 mg/dL.  3 hours after drinking the solution: less than 145 mg/dL.  Some substances can interfere with GTT results. These may include:  Blood pressure and heart failure medicines, including beta blockers, furosemide, and thiazides.  Anti-inflammatory medicines, including aspirin.  Nicotine.  Some psychiatric medicines.  Meaning of Results Outside Normal Value Ranges GTT test results that are above normal values may indicate a number of health problems, such as:  Gestational diabetes.  Acute stress response.  Cushing syndrome.  Tumors such as pheochromocytoma or glucagonoma.  Long-term kidney problems.  Pancreatitis.  Hyperthyroidism.  Current infection.  Discuss your test results with your health care provider. He or she will use the results to make a diagnosis and determine a treatment plan that is right for you. This information is not intended to replace advice given to you by your health care provider. Make sure you discuss any questions you have with your health care provider. Document Released: 02/28/2012 Document Revised: 02/04/2016 Document Reviewed: 01/03/2014 Elsevier Interactive Patient Education  2018 [employer]  Second Trimester of Pregnancy The second trimester is from week 13 through week 28, month 4 through 6. This is   often the time in pregnancy that you feel your best. Often times, morning sickness has lessened or quit. You may have more energy, and you may get hungry more often. Your unborn baby (fetus) is growing rapidly. At the end of the sixth month, he or she is about 9 inches long and weighs about 1  pounds. You will likely feel the baby move (quickening) between 18 and 20 weeks of pregnancy. Follow these instructions at home:  Avoid all smoking, herbs, and alcohol. Avoid drugs not approved by your doctor.  Do not use any tobacco products, including cigarettes, chewing tobacco, and electronic cigarettes. If you need help quitting, ask your doctor. You may get counseling or other support to help you quit.  Only take medicine as told by your doctor. Some medicines are safe and some are not during pregnancy.  Exercise only as told by your doctor. Stop exercising if you start having cramps.  Eat regular, healthy meals.  Wear a good support bra if your breasts are tender.  Do not use hot tubs, steam rooms, or saunas.  Wear your seat belt when driving.  Avoid raw meat, uncooked cheese, and liter boxes and soil used by cats.  Take your prenatal vitamins.  Take 1500-2000 milligrams of calcium daily starting at the 20th week of pregnancy until you deliver your baby.  Try taking medicine that helps you poop (stool softener) as needed, and if your doctor approves. Eat more fiber by eating fresh fruit, vegetables, and whole grains. Drink enough fluids to keep your pee (urine) clear or pale yellow.  Take warm water baths (sitz baths) to soothe pain or discomfort caused by hemorrhoids. Use hemorrhoid cream if your doctor approves.  If you have puffy, bulging veins (varicose veins), wear support hose. Raise (elevate) your feet for 15 minutes, 3-4 times a day. Limit salt in your diet.  Avoid heavy lifting, wear low heals, and sit up straight.  Rest with your legs raised if you have leg cramps or low back pain.  Visit your dentist if you have not gone during your pregnancy. Use a soft toothbrush to brush your teeth. Be gentle when you floss.  You can have sex (intercourse) unless your doctor tells you not to.  Go to your doctor visits. Get help if:  You feel dizzy.  You have mild  cramps or pressure in your lower belly (abdomen).  You have a nagging pain in your belly area.  You continue to feel sick to your stomach (nauseous), throw up (vomit), or have watery poop (diarrhea).  You have bad smelling fluid coming from your vagina.  You have pain with peeing (urination). Get help right away if:  You have a fever.  You are leaking fluid from your vagina.  You have spotting or bleeding from your vagina.  You have severe belly cramping or pain.  You lose or gain weight rapidly.  You have trouble catching your breath and have chest pain.  You notice sudden or extreme puffiness (swelling) of your face, hands, ankles, feet, or legs.  You have not felt the baby move in over an hour.  You have severe headaches that do not go away with medicine.  You have vision changes. This information is not intended to replace advice given to you by your health care provider. Make sure you discuss any questions you have with your health care provider. Document Released: 11/23/2009 Document Revised: 02/04/2016 Document Reviewed: 10/30/2012 Elsevier Interactive Patient Education  2017 [employer]  

## 2018-01-16 ENCOUNTER — Encounter: Payer: Self-pay | Admitting: Advanced Practice Midwife

## 2018-01-16 DIAGNOSIS — Z3480 Encounter for supervision of other normal pregnancy, unspecified trimester: Secondary | ICD-10-CM | POA: Insufficient documentation

## 2018-01-16 NOTE — Progress Notes (Signed)
  Subjective:    Patricia Cole is a G1P0 [redacted]w[redacted]d being seen today for her first obstetrical visit.  Her obstetrical history is significant for Late transfer of care due to loss of insurance. Patient does intend to breast feed. Pregnancy history fully reviewed.  Patient reports no complaints.  Vitals:   01/15/18 1001  BP: 118/75  Pulse: 94  Weight: 156 lb (70.8 kg)    HISTORY: OB History  Gravida Para Term Preterm AB Living  1            SAB TAB Ectopic Multiple Live Births               # Outcome Date GA Lbr Len/2nd Weight Sex Delivery Anes PTL Lv  1 Current            Past Medical History:  Diagnosis Date  . Medical history non-contributory   . Seasonal allergies    Past Surgical History:  Procedure Laterality Date  . WISDOM TOOTH EXTRACTION     Family History  Problem Relation Age of Onset  . Breast cancer Maternal Grandmother   . Breast cancer Paternal Grandmother      Exam    Uterus:     Pelvic Exam:    Perineum: DOne at new ob   Vulva: n/a   Vagina:  normal discharge   pH:    Cervix: n/a   Adnexa: not evaluated   Bony Pelvis: gynecoid  System: Breast:  normal appearance, no masses or tenderness   Skin: normal coloration and turgor, no rashes    Neurologic: oriented, grossly non-focal   Extremities: normal strength, tone, and muscle mass   HEENT neck supple with midline trachea   Mouth/Teeth mucous membranes moist, pharynx normal without lesions   Neck supple   Cardiovascular: regular rate and rhythm   Respiratory:  appears well, vitals normal, no respiratory distress, acyanotic, normal RR, ear and throat exam is normal, neck free of mass or lymphadenopathy, chest clear, no wheezing, crepitations, rhonchi, normal symmetric air entry   Abdomen: soft, non-tender; bowel sounds normal; no masses,  no organomegaly   Urinary: n/a      Assessment:    Pregnancy: G1P0 Patient Active Problem List   Diagnosis Date Noted  . Supervision of other normal  pregnancy, antepartum 01/16/2018  . HSV-1 infection 12/16/2015  . Internal derangement of right knee 05/29/2014  . Patellofemoral pain syndrome 05/29/2014  . Right knee pain 05/29/2014  . URI 09/20/2009  . FEVER UNSPECIFIED 09/20/2009  . COUGH 09/20/2009  . CONTUSION OF BACK 05/21/2009  . Acute pharyngitis 03/29/2009        Plan:     Initial labs drawn. Prenatal vitamins. Problem list reviewed and updated. Genetic Screening discussed First Screen: declined.  Ultrasound discussed; fetal survey: results reviewed.  Follow up in 2 weeks. 50% of 30 min visit spent on counseling and coordination of care.  Routines reviewed Welcomed to practice Reviewed staffing, MAU and learners    Wynelle Bourgeois 01/16/2018

## 2018-02-09 ENCOUNTER — Ambulatory Visit (INDEPENDENT_AMBULATORY_CARE_PROVIDER_SITE_OTHER): Payer: Medicaid Other | Admitting: Advanced Practice Midwife

## 2018-02-09 ENCOUNTER — Encounter: Payer: Self-pay | Admitting: Advanced Practice Midwife

## 2018-02-09 VITALS — BP 113/69 | HR 108 | Wt 164.0 lb

## 2018-02-09 DIAGNOSIS — Z3400 Encounter for supervision of normal first pregnancy, unspecified trimester: Secondary | ICD-10-CM

## 2018-02-09 DIAGNOSIS — Z3480 Encounter for supervision of other normal pregnancy, unspecified trimester: Secondary | ICD-10-CM

## 2018-02-09 NOTE — Progress Notes (Signed)
   PRENATAL VISIT NOTE  Subjective:  Patricia Cole is a 22 y.o. G1P0 at 3100w4d being seen today for ongoing prenatal care.  She is currently monitored for the following issues for this low-risk pregnancy and has Acute pharyngitis; URI; FEVER UNSPECIFIED; COUGH; CONTUSION OF BACK; HSV-1 infection; Internal derangement of right knee; Patellofemoral pain syndrome; Right knee pain; and Supervision of other normal pregnancy, antepartum on their problem list.  Patient reports no complaints.  Contractions: Not present. Vag. Bleeding: None.  Movement: Present. Denies leaking of fluid.   The following portions of the patient's history were reviewed and updated as appropriate: allergies, current medications, past family history, past medical history, past social history, past surgical history and problem list. Problem list updated.  Objective:   Vitals:   02/09/18 0806  BP: 113/69  Pulse: (!) 108  Weight: 164 lb (74.4 kg)    Fetal Status: Fetal Heart Rate (bpm): 141   Movement: Present     General:  Alert, oriented and cooperative. Patient is in no acute distress.  Skin: Skin is warm and dry. No rash noted.   Cardiovascular: Normal heart rate noted  Respiratory: Normal respiratory effort, no problems with respiration noted  Abdomen: Soft, gravid, appropriate for gestational age.  Pain/Pressure: Absent     Pelvic: Cervical exam deferred        Extremities: Normal range of motion.  Edema: Trace  Mental Status: Normal mood and affect. Normal behavior. Normal judgment and thought content.   Assessment and Plan:  Pregnancy: G1P0 at 57100w4d  1. Supervision of normal first pregnancy, antepartum       Had a BH contraction during exam, so I took that opportunity to discuss BH UCs and preterm labor signs.    - 2Hr GTT w/ 1 Hr Carpenter 75 g - CBC - HIV antibody (with reflex) - RPR  2. Supervision of other normal pregnancy, antepartum      Had TDAP in November at work  Preterm labor symptoms  and general obstetric precautions including but not limited to vaginal bleeding, contractions, leaking of fluid and fetal movement were reviewed in detail with the patient. Please refer to After Visit Summary for other counseling recommendations.  Return in about 2 weeks (around 02/23/2018).  No future appointments.  Patricia Cole, CNM

## 2018-02-09 NOTE — Patient Instructions (Signed)

## 2018-02-12 LAB — CBC
HEMATOCRIT: 31 % — AB (ref 35.0–45.0)
Hemoglobin: 10.6 g/dL — ABNORMAL LOW (ref 11.7–15.5)
MCH: 28.9 pg (ref 27.0–33.0)
MCHC: 34.2 g/dL (ref 32.0–36.0)
MCV: 84.5 fL (ref 80.0–100.0)
MPV: 10.2 fL (ref 7.5–12.5)
Platelets: 304 10*3/uL (ref 140–400)
RBC: 3.67 10*6/uL — ABNORMAL LOW (ref 3.80–5.10)
RDW: 13 % (ref 11.0–15.0)
WBC: 12.8 10*3/uL — ABNORMAL HIGH (ref 3.8–10.8)

## 2018-02-12 LAB — 2HR GTT W 1 HR, CARPENTER, 75 G
GLUCOSE, 1 HR, GEST: 142 mg/dL (ref 65–179)
GLUCOSE, FASTING, GEST: 80 mg/dL (ref 65–91)
Glucose, 2 Hr, Gest: 115 mg/dL (ref 65–152)

## 2018-02-12 LAB — HIV ANTIBODY (ROUTINE TESTING W REFLEX): HIV: NONREACTIVE

## 2018-02-12 LAB — RPR: RPR: NONREACTIVE

## 2018-02-15 ENCOUNTER — Encounter (HOSPITAL_COMMUNITY): Payer: Self-pay | Admitting: *Deleted

## 2018-02-15 ENCOUNTER — Inpatient Hospital Stay (HOSPITAL_COMMUNITY)
Admission: AD | Admit: 2018-02-15 | Discharge: 2018-02-15 | Disposition: A | Discharge status: Home / Self Care | Payer: Medicaid Other | Source: Ambulatory Visit | Attending: Obstetrics & Gynecology | Admitting: Obstetrics & Gynecology

## 2018-02-15 DIAGNOSIS — R102 Pelvic and perineal pain: Secondary | ICD-10-CM | POA: Insufficient documentation

## 2018-02-15 DIAGNOSIS — B9689 Other specified bacterial agents as the cause of diseases classified elsewhere: Secondary | ICD-10-CM | POA: Diagnosis not present

## 2018-02-15 DIAGNOSIS — O26899 Other specified pregnancy related conditions, unspecified trimester: Secondary | ICD-10-CM

## 2018-02-15 DIAGNOSIS — N898 Other specified noninflammatory disorders of vagina: Secondary | ICD-10-CM | POA: Diagnosis present

## 2018-02-15 DIAGNOSIS — Z3A30 30 weeks gestation of pregnancy: Secondary | ICD-10-CM | POA: Insufficient documentation

## 2018-02-15 DIAGNOSIS — Z3689 Encounter for other specified antenatal screening: Secondary | ICD-10-CM

## 2018-02-15 DIAGNOSIS — O26893 Other specified pregnancy related conditions, third trimester: Secondary | ICD-10-CM

## 2018-02-15 DIAGNOSIS — R109 Unspecified abdominal pain: Secondary | ICD-10-CM

## 2018-02-15 DIAGNOSIS — Z3480 Encounter for supervision of other normal pregnancy, unspecified trimester: Secondary | ICD-10-CM

## 2018-02-15 DIAGNOSIS — O23593 Infection of other part of genital tract in pregnancy, third trimester: Secondary | ICD-10-CM | POA: Insufficient documentation

## 2018-02-15 DIAGNOSIS — N760 Acute vaginitis: Secondary | ICD-10-CM

## 2018-02-15 LAB — WET PREP, GENITAL
SPERM: NONE SEEN
TRICH WET PREP: NONE SEEN
YEAST WET PREP: NONE SEEN

## 2018-02-15 LAB — URINALYSIS, ROUTINE W REFLEX MICROSCOPIC
BILIRUBIN URINE: NEGATIVE
Glucose, UA: NEGATIVE mg/dL
Hgb urine dipstick: NEGATIVE
KETONES UR: NEGATIVE mg/dL
Nitrite: NEGATIVE
PH: 6 (ref 5.0–8.0)
Protein, ur: NEGATIVE mg/dL
Specific Gravity, Urine: 1.02 (ref 1.005–1.030)

## 2018-02-15 MED ORDER — METRONIDAZOLE 500 MG PO TABS: 500.0000 mg | ORAL_TABLET | Freq: Two times a day (BID) | ORAL | 0 refills | Status: DC

## 2018-02-15 MED ORDER — ACETAMINOPHEN 500 MG PO TABS
1000.0000 mg | ORAL_TABLET | Freq: Four times a day (QID) | ORAL | Status: DC | PRN
  Administered 2018-02-15: 1000 mg via ORAL
  Filled 2018-02-15: qty 2

## 2018-02-15 NOTE — MAU Note (Signed)
Urine in lab, not enough urine for culture

## 2018-02-15 NOTE — MAU Note (Signed)
Pt reports a lot of cramping and a lot of pressure in her pelvic area. Denies bleeding but reports increased discharge.

## 2018-02-15 NOTE — MAU Provider Note (Signed)
History     CSN: [id number]  Arrival date and time: 02/15/18 1539   First Provider Initiated Contact with Patient 02/15/18 1617     Chief Complaint  Patient presents with  . Pelvic Pain  . Abdominal Pain  . Vaginal Discharge   G1 @30 .3 wks here with pelvic pressure and low abd cramping. Sx started 2 days ago. Cramping is constant, bilateral and worse with standing. Denies VB, LOF, or ctx. Having increased thick white vaginal discharge with malodor. No itching. No new partner. No hx of STIs. Denies urinary sx. Good FM. Her pregnancy has been uncomplicated.   OB History    Gravida  1   Para      Term      Preterm      AB      Living        SAB      TAB      Ectopic      Multiple      Live Births              Past Medical History:  Diagnosis Date  . Medical history non-contributory   . Seasonal allergies     Past Surgical History:  Procedure Laterality Date  . WISDOM TOOTH EXTRACTION      Family History  Problem Relation Age of Onset  . Breast cancer Maternal Grandmother   . Breast cancer Paternal Grandmother     Social History   Tobacco Use  . Smoking status: Never Smoker  . Smokeless tobacco: Never Used  Substance Use Topics  . Alcohol use: No  . Drug use: No    Allergies: No Known Allergies  Medications Prior to Admission  Medication Sig Dispense Refill Last Dose  . Prenatal Vit-Fe Fumarate-FA (PRENATAL MULTIVITAMIN) TABS tablet Take 1 tablet by mouth daily at 12 noon.   02/14/2018 at Unknown time    Review of Systems  Constitutional: Negative for fever.  Gastrointestinal: Positive for abdominal pain. Negative for constipation and diarrhea.  Genitourinary: Positive for frequency and vaginal discharge. Negative for dysuria, hematuria, urgency and vaginal bleeding.  Musculoskeletal: Negative for back pain.   Physical Exam   Blood pressure 117/68, pulse 79, temperature 98.4 F (36.9 C), temperature source Oral, resp. rate 16, height  5\' 1"  (1.549 m), weight 166 lb (75.3 kg), SpO2 100 %.  Physical Exam  Constitutional: She is oriented to person, place, and time. She appears well-developed and well-nourished.  HENT:  Head: Normocephalic and atraumatic.  Neck: Normal range of motion.  Cardiovascular: Normal rate.  Respiratory: Effort normal. No respiratory distress.  GI: Soft. She exhibits no distension. There is no tenderness.  gravid  Genitourinary:  Genitourinary Comments: External: no lesions or erythema Vagina: rugated, pink, moist, thick white discharge Cervix closed/thick   Musculoskeletal: Normal range of motion.  Neurological: She is alert and oriented to person, place, and time.  Skin: Skin is warm and dry.  Psychiatric: She has a normal mood and affect.  EFM: 135 bpm, mod variability, + accels, no decels Toco: irritability  Results for orders placed or performed during the hospital encounter of 02/15/18 (from the past 24 hour(s))  Urinalysis, Routine w reflex microscopic     Status: Abnormal   Collection Time: 02/15/18  3:50 PM  Result Value Ref Range   Color, Urine YELLOW YELLOW   APPearance CLOUDY (A) CLEAR   Specific Gravity, Urine 1.020 1.005 - 1.030   pH 6.0 5.0 - 8.0   Glucose, UA  NEGATIVE NEGATIVE mg/dL   Hgb urine dipstick NEGATIVE NEGATIVE   Bilirubin Urine NEGATIVE NEGATIVE   Ketones, ur NEGATIVE NEGATIVE mg/dL   Protein, ur NEGATIVE NEGATIVE mg/dL   Nitrite NEGATIVE NEGATIVE   Leukocytes, UA MODERATE (A) NEGATIVE   RBC / HPF 0-5 0 - 5 RBC/hpf   WBC, UA 11-20 0 - 5 WBC/hpf   Bacteria, UA RARE (A) NONE SEEN   Squamous Epithelial / LPF 21-50 0 - 5   Mucus PRESENT    Amorphous Crystal PRESENT   Wet prep, genital     Status: Abnormal   Collection Time: 02/15/18  4:32 PM  Result Value Ref Range   Yeast Wet Prep HPF POC NONE SEEN NONE SEEN   Trich, Wet Prep NONE SEEN NONE SEEN   Clue Cells Wet Prep HPF POC PRESENT (A) NONE SEEN   WBC, Wet Prep HPF POC FEW (A) NONE SEEN   Sperm NONE  SEEN    MAU Course  Procedures Tylenol Heating pad Po hydration  MDM Labs ordered and reviewed. Pain improved after Tylenol. No evidence of UTI or PTL. Will treat BV. Encouraged increased hydration. Stable for discharge home.   Assessment and Plan   1. [redacted] weeks gestation of pregnancy   2. Supervision of other normal pregnancy, antepartum   3. NST (non-stress test) reactive   4. Abdominal cramping affecting pregnancy   5. Bacterial vaginosis    Discharge home Follow up with OB as scheduled PTL precautions Rx Flagyl  Allergies as of 02/15/2018   No Known Allergies     Medication List    TAKE these medications   metroNIDAZOLE 500 MG tablet Commonly known as:  FLAGYL Take 1 tablet (500 mg total) by mouth 2 (two) times daily.   prenatal multivitamin Tabs tablet Take 1 tablet by mouth daily at 12 noon.      Donette Larry, CNM 02/15/2018, 5:41 PM

## 2018-02-15 NOTE — Discharge Instructions (Signed)
Bacterial Vaginosis °Bacterial vaginosis is an infection of the vagina. It happens when too many germs (bacteria) grow in the vagina. This infection puts you at risk for infections from sex (STIs). Treating this infection can lower your risk for some STIs. You should also treat this if you are pregnant. It can cause your baby to be born early. °Follow these instructions at home: °Medicines °· Take over-the-counter and prescription medicines only as told by your doctor. °· Take or use your antibiotic medicine as told by your doctor. Do not stop taking or using it even if you start to feel better. °General instructions °· If you your sexual partner is a woman, tell her that you have this infection. She needs to get treatment if she has symptoms. If you have a female partner, he does not need to be treated. °· During treatment: °? Avoid sex. °? Do not douche. °? Avoid alcohol as told. °? Avoid breastfeeding as told. °· Drink enough fluid to keep your pee (urine) clear or pale yellow. °· Keep your vagina and butt (rectum) clean. °? Wash the area with warm water every day. °? Wipe from front to back after you use the toilet. °· Keep all follow-up visits as told by your doctor. This is important. °Preventing this condition °· Do not douche. °· Use only warm water to wash around your vagina. °· Use protection when you have sex. This includes: °? Latex condoms. °? Dental dams. °· Limit how many people you have sex with. It is best to only have sex with the same person (be monogamous). °· Get tested for STIs. Have your partner get tested. °· Wear underwear that is cotton or lined with cotton. °· Avoid tight pants and pantyhose. This is most important in summer. °· Do not use any products that have nicotine or tobacco in them. These include cigarettes and e-cigarettes. If you need help quitting, ask your doctor. °· Do not use illegal drugs. °· Limit how much alcohol you drink. °Contact a doctor if: °· Your symptoms do not get  better, even after you are treated. °· You have more discharge or pain when you pee (urinate). °· You have a fever. °· You have pain in your belly (abdomen). °· You have pain with sex. °· Your bleed from your vagina between periods. °Summary °· This infection happens when too many germs (bacteria) grow in the vagina. °· Treating this condition can lower your risk for some infections from sex (STIs). °· You should also treat this if you are pregnant. It can cause early (premature) birth. °· Do not stop taking or using your antibiotic medicine even if you start to feel better. °This information is not intended to replace advice given to you by your health care provider. Make sure you discuss any questions you have with your health care provider. °Document Released: 06/07/2008 Document Revised: 05/14/2016 Document Reviewed: 05/14/2016 °Elsevier Interactive Patient Education © 2017 [employer] ° °Braxton Hicks Contractions °Contractions of the uterus can occur throughout pregnancy, but they are not always a sign that you are in labor. You may have practice contractions called Braxton Hicks contractions. These false labor contractions are sometimes confused with true labor. °What are Braxton Hicks contractions? °Braxton Hicks contractions are tightening movements that occur in the muscles of the uterus before labor. Unlike true labor contractions, these contractions do not result in opening (dilation) and thinning of the cervix. Toward the end of pregnancy (32-34 weeks), Braxton Hicks contractions can happen more often   and may become stronger. These contractions are sometimes difficult to tell apart from true labor because they can be very uncomfortable. You should not feel embarrassed if you go to the hospital with false labor. °Sometimes, the only way to tell if you are in true labor is for your health care provider to look for changes in the cervix. The health care provider will do a physical exam and may monitor  your contractions. If you are not in true labor, the exam should show that your cervix is not dilating and your water has not broken. °If there are other health problems associated with your pregnancy, it is completely safe for you to be sent home with false labor. You may continue to have Braxton Hicks contractions until you go into true labor. °How to tell the difference between true labor and false labor °True labor °· Contractions last 30-70 seconds. °· Contractions become very regular. °· Discomfort is usually felt in the top of the uterus, and it spreads to the lower abdomen and low back. °· Contractions do not go away with walking. °· Contractions usually become more intense and increase in frequency. °· The cervix dilates and gets thinner. °False labor °· Contractions are usually shorter and not as strong as true labor contractions. °· Contractions are usually irregular. °· Contractions are often felt in the front of the lower abdomen and in the groin. °· Contractions may go away when you walk around or change positions while lying down. °· Contractions get weaker and are shorter-lasting as time goes on. °· The cervix usually does not dilate or become thin. °Follow these instructions at home: °· Take over-the-counter and prescription medicines only as told by your health care provider. °· Keep up with your usual exercises and follow other instructions from your health care provider. °· Eat and drink lightly if you think you are going into labor. °· If Braxton Hicks contractions are making you uncomfortable: °? Change your position from lying down or resting to walking, or change from walking to resting. °? Sit and rest in a tub of warm water. °? Drink enough fluid to keep your urine pale yellow. Dehydration may cause these contractions. °? Do slow and deep breathing several times an hour. °· Keep all follow-up prenatal visits as told by your health care provider. This is important. °Contact a health care  provider if: °· You have a fever. °· You have continuous pain in your abdomen. °Get help right away if: °· Your contractions become stronger, more regular, and closer together. °· You have fluid leaking or gushing from your vagina. °· You pass blood-tinged mucus (bloody show). °· You have bleeding from your vagina. °· You have low back pain that you never had before. °· You feel your baby’s head pushing down and causing pelvic pressure. °· Your baby is not moving inside you as much as it used to. °Summary °· Contractions that occur before labor are called Braxton Hicks contractions, false labor, or practice contractions. °· Braxton Hicks contractions are usually shorter, weaker, farther apart, and less regular than true labor contractions. True labor contractions usually become progressively stronger and regular and they become more frequent. °· Manage discomfort from Braxton Hicks contractions by changing position, resting in a warm bath, drinking plenty of water, or practicing deep breathing. °This information is not intended to replace advice given to you by your health care provider. Make sure you discuss any questions you have with your health care provider. °Document Released: 01/12/2017   Document Revised: 01/12/2017 Document Reviewed: 01/12/2017 °Elsevier Interactive Patient Education © 2018 [employer] Inc[employer] ° °

## 2018-02-16 LAB — GC/CHLAMYDIA PROBE AMP (~~LOC~~) NOT AT ARMC
CHLAMYDIA, DNA PROBE: NEGATIVE
NEISSERIA GONORRHEA: NEGATIVE

## 2018-02-22 ENCOUNTER — Ambulatory Visit (INDEPENDENT_AMBULATORY_CARE_PROVIDER_SITE_OTHER): Payer: Medicaid Other | Admitting: Obstetrics & Gynecology

## 2018-02-22 VITALS — BP 128/77 | HR 100 | Wt 167.0 lb

## 2018-02-22 DIAGNOSIS — B009 Herpesviral infection, unspecified: Secondary | ICD-10-CM

## 2018-02-22 DIAGNOSIS — Z3480 Encounter for supervision of other normal pregnancy, unspecified trimester: Secondary | ICD-10-CM

## 2018-02-22 DIAGNOSIS — Z3483 Encounter for supervision of other normal pregnancy, third trimester: Secondary | ICD-10-CM

## 2018-02-22 NOTE — Progress Notes (Signed)
   PRENATAL VISIT NOTE  Subjective:  Patricia Cole is a 22 y.o. married G1P0 at 4533w3d being seen today for ongoing prenatal care.  She is currently monitored for the following issues for this low-risk pregnancy and has Acute pharyngitis; URI; FEVER UNSPECIFIED; COUGH; CONTUSION OF BACK; HSV-1 infection; Internal derangement of right knee; Patellofemoral pain syndrome; Right knee pain; and Supervision of other normal pregnancy, antepartum on their problem list.  Patient reports no complaints.  Contractions: Not present. Vag. Bleeding: None.  Movement: Present. Denies leaking of fluid.   The following portions of the patient's history were reviewed and updated as appropriate: allergies, current medications, past family history, past medical history, past social history, past surgical history and problem list. Problem list updated.  Objective:   Vitals:   02/22/18 1311  BP: 128/77  Pulse: 100  Weight: 167 lb (75.8 kg)    Fetal Status:     Movement: Present     General:  Alert, oriented and cooperative. Patient is in no acute distress.  Skin: Skin is warm and dry. No rash noted.   Cardiovascular: Normal heart rate noted  Respiratory: Normal respiratory effort, no problems with respiration noted  Abdomen: Soft, gravid, appropriate for gestational age.  Pain/Pressure: Absent     Pelvic: Cervical exam deferred        Extremities: Normal range of motion.  Edema: Trace  Mental Status: Normal mood and affect. Normal behavior. Normal judgment and thought content.   Assessment and Plan:  Pregnancy: G1P0 at 6233w3d  1. HSV-1 infection   2. Supervision of other normal pregnancy, antepartum   Preterm labor symptoms and general obstetric precautions including but not limited to vaginal bleeding, contractions, leaking of fluid and fetal movement were reviewed in detail with the patient. Please refer to After Visit Summary for other counseling recommendations.  No follow-ups on file.  No  future appointments.  Allie BossierMyra C Zyliah Schier, MD

## 2018-03-07 ENCOUNTER — Ambulatory Visit (INDEPENDENT_AMBULATORY_CARE_PROVIDER_SITE_OTHER): Payer: Medicaid Other | Admitting: Obstetrics & Gynecology

## 2018-03-07 VITALS — BP 122/76 | HR 101 | Wt 170.0 lb

## 2018-03-07 DIAGNOSIS — Z3480 Encounter for supervision of other normal pregnancy, unspecified trimester: Secondary | ICD-10-CM

## 2018-03-07 NOTE — Progress Notes (Signed)
   PRENATAL VISIT NOTE  Subjective:  Patricia OverlieKaitlyn E Cole is a 22 y.o. married G1P0 at 5519w2d being seen today for ongoing prenatal care.  She is currently monitored for the following issues for this low-risk pregnancy and has Acute pharyngitis; URI; FEVER UNSPECIFIED; COUGH; CONTUSION OF BACK; HSV-1 infection; Internal derangement of right knee; Patellofemoral pain syndrome; Right knee pain; and Supervision of other normal pregnancy, antepartum on their problem list.  Patient reports no complaints.  Contractions: Not present. Vag. Bleeding: None.  Movement: Present. Denies leaking of fluid.   The following portions of the patient's history were reviewed and updated as appropriate: allergies, current medications, past family history, past medical history, past social history, past surgical history and problem list. Problem list updated.  Objective:   Vitals:   03/07/18 1044  BP: 122/76  Pulse: (!) 101  Weight: 170 lb (77.1 kg)    Fetal Status: Fetal Heart Rate (bpm): 145   Movement: Present     General:  Alert, oriented and cooperative. Patient is in no acute distress.  Skin: Skin is warm and dry. No rash noted.   Cardiovascular: Normal heart rate noted  Respiratory: Normal respiratory effort, no problems with respiration noted  Abdomen: Soft, gravid, appropriate for gestational age.  Pain/Pressure: Absent     Pelvic: Cervical exam deferred        Extremities: Normal range of motion.  Edema: Trace  Mental Status: Normal mood and affect. Normal behavior. Normal judgment and thought content.   Assessment and Plan:  Pregnancy: G1P0 at 7519w2d  1. Supervision of other normal pregnancy, antepartum   Preterm labor symptoms and general obstetric precautions including but not limited to vaginal bleeding, contractions, leaking of fluid and fetal movement were reviewed in detail with the patient. Please refer to After Visit Summary for other counseling recommendations.  Return in about 2 weeks  (around 03/21/2018) for needs pap smear results- primary care.  No future appointments.  Allie BossierMyra C Jamond Neels, MD

## 2018-03-14 ENCOUNTER — Other Ambulatory Visit: Payer: Self-pay

## 2018-03-14 ENCOUNTER — Inpatient Hospital Stay (HOSPITAL_COMMUNITY)
Admission: AD | Admit: 2018-03-14 | Discharge: 2018-03-14 | Disposition: A | Discharge status: Home / Self Care | Payer: Medicaid Other | Source: Ambulatory Visit | Attending: Obstetrics and Gynecology | Admitting: Obstetrics and Gynecology

## 2018-03-14 ENCOUNTER — Encounter (HOSPITAL_COMMUNITY): Payer: Self-pay

## 2018-03-14 DIAGNOSIS — O219 Vomiting of pregnancy, unspecified: Secondary | ICD-10-CM | POA: Diagnosis present

## 2018-03-14 DIAGNOSIS — O368130 Decreased fetal movements, third trimester, not applicable or unspecified: Secondary | ICD-10-CM

## 2018-03-14 DIAGNOSIS — R102 Pelvic and perineal pain: Secondary | ICD-10-CM

## 2018-03-14 DIAGNOSIS — Z3480 Encounter for supervision of other normal pregnancy, unspecified trimester: Secondary | ICD-10-CM

## 2018-03-14 DIAGNOSIS — O26893 Other specified pregnancy related conditions, third trimester: Secondary | ICD-10-CM

## 2018-03-14 DIAGNOSIS — Z3A34 34 weeks gestation of pregnancy: Secondary | ICD-10-CM | POA: Diagnosis not present

## 2018-03-14 LAB — URINALYSIS, ROUTINE W REFLEX MICROSCOPIC
Glucose, UA: 50 mg/dL — AB
KETONES UR: NEGATIVE mg/dL
LEUKOCYTES UA: NEGATIVE
NITRITE: NEGATIVE
PROTEIN: 100 mg/dL — AB
Specific Gravity, Urine: 1.035 — ABNORMAL HIGH (ref 1.005–1.030)
pH: 5 (ref 5.0–8.0)

## 2018-03-14 MED ORDER — PROMETHAZINE HCL 25 MG/ML IJ SOLN
25.0000 mg | Freq: Once | INTRAVENOUS | Status: AC
  Administered 2018-03-14: 25 mg via INTRAVENOUS
  Filled 2018-03-14: qty 1

## 2018-03-14 MED ORDER — M.V.I. ADULT IV INJ
INJECTION | Freq: Once | INTRAVENOUS | Status: AC
  Administered 2018-03-14: 17:00:00 via INTRAVENOUS
  Filled 2018-03-14: qty 1000

## 2018-03-14 NOTE — MAU Provider Note (Signed)
History     CSN: [id number]  Arrival date and time: 03/14/18 1442   First Provider Initiated Contact with Patient 03/14/18 1539      Chief Complaint  Patient presents with  . Pelvic Pain  . Decreased Fetal Movement  . Nausea   HPI  Ms.  Patricia Cole is a 22 y.o. year old G1P0 female at 7479w2d weeks gestation who presents to MAU reporting lots of pelvic pressure x 2 days. She reports the pressure is more pain with movement and/or standing. She also reports N/V today; 2 episodes in 24 hrs. She called the office today, because of DFM. She was told to come here for evaluation, because "there was no U/S tech in the office".    Past Medical History:  Diagnosis Date  . Medical history non-contributory   . Seasonal allergies     Past Surgical History:  Procedure Laterality Date  . WISDOM TOOTH EXTRACTION      Family History  Problem Relation Age of Onset  . Breast cancer Maternal Grandmother   . Breast cancer Paternal Grandmother     Social History   Tobacco Use  . Smoking status: Never Smoker  . Smokeless tobacco: Never Used  Substance Use Topics  . Alcohol use: No  . Drug use: No    Allergies: No Known Allergies  Medications Prior to Admission  Medication Sig Dispense Refill Last Dose  . metroNIDAZOLE (FLAGYL) 500 MG tablet Take 1 tablet (500 mg total) by mouth 2 (two) times daily. (Patient not taking: Reported on 03/07/2018) 14 tablet 0 Not Taking  . Prenatal Vit-Fe Fumarate-FA (PRENATAL MULTIVITAMIN) TABS tablet Take 1 tablet by mouth daily at 12 noon.   Taking    Review of Systems  Constitutional: Negative.   HENT: Negative.   Eyes: Negative.   Respiratory: Negative.   Cardiovascular: Negative.   Gastrointestinal: Positive for nausea and vomiting (x 2 in 24 hrs).  Endocrine: Negative.   Genitourinary: Positive for pelvic pain.  Musculoskeletal: Negative.   Skin: Negative.   Allergic/Immunologic: Negative.   Neurological: Negative.   Hematological:  Negative.   Psychiatric/Behavioral: Negative.    Physical Exam   Blood pressure 117/75, pulse 93, temperature 98.2 F (36.8 C), temperature source Oral, resp. rate 17, weight 172 lb 4 oz (78.1 kg), SpO2 99 %.  Physical Exam  Nursing note and vitals reviewed. Constitutional: She is oriented to person, place, and time. She appears well-developed and well-nourished.  HENT:  Head: Normocephalic and atraumatic.  Eyes: Pupils are equal, round, and reactive to light.  Neck: Normal range of motion.  Cardiovascular: Normal rate.  Respiratory: Effort normal.  GI: Soft.  Genitourinary:  Genitourinary Comments: Dilation: Closed Effacement (%): Thick Cervical Position: Posterior Exam by: Raelyn Moraolitta Felissa Blouch, CNM    Musculoskeletal: Normal range of motion.  Neurological: She is alert and oriented to person, place, and time.  Skin: Skin is warm and dry.  Psychiatric: She has a normal mood and affect. Her behavior is normal. Judgment and thought content normal.   Cervical recheck at 1905: cervix unchanged  MAU Course  Procedures  MDM [facility] IVF's: Phenergan 25 mg in LR 1000 ml @ bolus rate; then MVI in LR 1000 ml @ 500 ml/hr -- improved nausea and no vomiting NST - FHR: 130 bpm / moderate variability / accels present / decels absent / TOCO: regular every 3-7 mins //UC's continue at every 5-6 mins  *Consult with Dr. Despina HiddenEure @ 1910 - notified of patient's complaints, assessments, lab  results, tx plan d/c home with F/U on 7/10 as scheduled - ok to d/c home, agrees with plan   Results for orders placed or performed during the hospital encounter of 03/14/18 (from the past 24 hour(s))  Urinalysis, Routine w reflex microscopic     Status: Abnormal   Collection Time: 03/14/18  3:08 PM  Result Value Ref Range   Color, Urine YELLOW YELLOW   APPearance CLOUDY (A) CLEAR   Specific Gravity, Urine 1.035 (H) 1.005 - 1.030   pH 5.0 5.0 - 8.0   Glucose, UA 50 (A) NEGATIVE mg/dL   Hgb urine dipstick SMALL (A)  NEGATIVE   Bilirubin Urine SMALL (A) NEGATIVE   Ketones, ur NEGATIVE NEGATIVE mg/dL   Protein, ur 295 (A) NEGATIVE mg/dL   Nitrite NEGATIVE NEGATIVE   Leukocytes, UA NEGATIVE NEGATIVE   RBC / HPF 21-50 0 - 5 RBC/hpf   WBC, UA 6-10 0 - 5 WBC/hpf   Bacteria, UA MANY (A) NONE SEEN   Squamous Epithelial / LPF 21-50 0 - 5   Mucus PRESENT      Assessment and Plan  Pelvic pain - Information provided on pelvic pain in pregnancy & preventing PTL   Nausea/vomiting in pregnancy  - Information provided on morning sickness  - Discharge patient - Keep scheduled appt on 03/21/18 - Patient verbalized an understanding of the plan of care and agrees.     Raelyn Mora, MSN, CNM 03/14/2018, 3:40 PM

## 2018-03-14 NOTE — MAU Note (Signed)
For the past 2 days has been having a lot of pelvic pressure.  Becomes more pain then pressure with movement or standing. Was nauseated today, threw up at work.  Called office, baby is not moving as much today; no US tech in office - so told to come here.

## 2018-03-21 ENCOUNTER — Ambulatory Visit (INDEPENDENT_AMBULATORY_CARE_PROVIDER_SITE_OTHER): Payer: Medicaid Other | Admitting: Obstetrics & Gynecology

## 2018-03-21 VITALS — BP 125/82 | HR 116 | Wt 173.0 lb

## 2018-03-21 DIAGNOSIS — O368130 Decreased fetal movements, third trimester, not applicable or unspecified: Secondary | ICD-10-CM | POA: Diagnosis not present

## 2018-03-21 DIAGNOSIS — Z3403 Encounter for supervision of normal first pregnancy, third trimester: Secondary | ICD-10-CM

## 2018-03-21 LAB — POCT URINALYSIS DIPSTICK
BILIRUBIN UA: NEGATIVE
Glucose, UA: NEGATIVE
Ketones, UA: NEGATIVE
LEUKOCYTES UA: NEGATIVE
Nitrite, UA: NEGATIVE
PH UA: 5 (ref 5.0–8.0)
PROTEIN UA: NEGATIVE
Spec Grav, UA: 1.02 (ref 1.010–1.025)
UROBILINOGEN UA: 0.2 U/dL

## 2018-03-21 NOTE — Progress Notes (Signed)
   PRENATAL VISIT NOTE  Subjective:  Marcelle OverlieKaitlyn E Ferencz is a 22 y.o. G1P0 at 2556w2d being seen today for ongoing prenatal care.  She is currently monitored for the following issues for this low-risk pregnancy and has HSV-1 infection; Internal derangement of right knee; Patellofemoral pain syndrome; Right knee pain; Supervision of other normal pregnancy, antepartum; Pelvic pain; and Nausea/vomiting in pregnancy on their problem list.  Patient reports decreased fetal mvmt.  Contractions: Not present. Vag. Bleeding: None.  Movement: Present. Denies leaking of fluid.   The following portions of the patient's history were reviewed and updated as appropriate: allergies, current medications, past family history, past medical history, past social history, past surgical history and problem list. Problem list updated.  Objective:   Vitals:   03/21/18 0932  BP: 125/82  Pulse: (!) 116  Weight: 173 lb (78.5 kg)    Fetal Status:     Movement: Present  Presentation: Vertex  General:  Alert, oriented and cooperative. Patient is in no acute distress.  Skin: Skin is warm and dry. No rash noted.   Cardiovascular: Normal heart rate noted  Respiratory: Normal respiratory effort, no problems with respiration noted  Abdomen: Soft, gravid, appropriate for gestational age.  Pain/Pressure: Present     Pelvic: Cervical exam performed      closed, high  Extremities: Normal range of motion.  Edema: Trace  Mental Status: Normal mood and affect. Normal behavior. Normal judgment and thought content.   Assessment and Plan:  Pregnancy: G1P0 at 4156w2d  1. Encounter for supervision of normal first pregnancy in third trimester -NST and AFI for decreased fetal mvmt.  Recent feels better with the reassurance of these tests.  No evidence of preterm labor.  Patient will come back in a week as she gets reassurance by coming to the physician.  EVS at that time. - POCT Urinalysis Dipstick--mildly dehydrated, increase  fluids.  Preterm labor symptoms and general obstetric precautions including but not limited to vaginal bleeding, contractions, leaking of fluid and fetal movement were reviewed in detail with the patient. Please refer to After Visit Summary for other counseling recommendations.  Return in about 1 week (around 03/28/2018).  Future Appointments  Date Time Provider Department Center  03/27/2018  4:30 PM Allie Bossierove, Myra C, MD [facility]    Elsie LincolnKelly Jancarlos Thrun, MD

## 2018-03-26 ENCOUNTER — Inpatient Hospital Stay (HOSPITAL_COMMUNITY)
Admission: AD | Admit: 2018-03-26 | Discharge: 2018-03-26 | Disposition: A | Discharge status: Home / Self Care | Payer: Medicaid Other | Source: Ambulatory Visit | Attending: Obstetrics and Gynecology | Admitting: Obstetrics and Gynecology

## 2018-03-26 ENCOUNTER — Encounter (HOSPITAL_COMMUNITY): Payer: Self-pay | Admitting: *Deleted

## 2018-03-26 DIAGNOSIS — O4703 False labor before 37 completed weeks of gestation, third trimester: Secondary | ICD-10-CM | POA: Diagnosis not present

## 2018-03-26 DIAGNOSIS — O219 Vomiting of pregnancy, unspecified: Secondary | ICD-10-CM

## 2018-03-26 DIAGNOSIS — O479 False labor, unspecified: Secondary | ICD-10-CM

## 2018-03-26 DIAGNOSIS — Z3A36 36 weeks gestation of pregnancy: Secondary | ICD-10-CM | POA: Insufficient documentation

## 2018-03-26 DIAGNOSIS — Z3480 Encounter for supervision of other normal pregnancy, unspecified trimester: Secondary | ICD-10-CM

## 2018-03-26 DIAGNOSIS — R102 Pelvic and perineal pain: Secondary | ICD-10-CM

## 2018-03-26 LAB — URINALYSIS, ROUTINE W REFLEX MICROSCOPIC
Bilirubin Urine: NEGATIVE
Glucose, UA: NEGATIVE mg/dL
HGB URINE DIPSTICK: NEGATIVE
Ketones, ur: NEGATIVE mg/dL
Leukocytes, UA: NEGATIVE
NITRITE: NEGATIVE
Protein, ur: NEGATIVE mg/dL
SPECIFIC GRAVITY, URINE: 1.01 (ref 1.005–1.030)
pH: 6 (ref 5.0–8.0)

## 2018-03-26 NOTE — MAU Provider Note (Signed)
Ms. Patricia Cole is a G1P0 at 6996w0d seen in MAU for labor. RN labor check, not seen by provider. SVE by RN Dilation: Closed Effacement (%): Thick Exam by:: Zenia ResidesNikki Risheq, RN    NST - FHR: 135 bpm / moderate variability / accels present / decels absent / TOCO: irregular UC's  Plan:  D/C home with labor precautions Keep scheduled appt with [facility] on 03/27/2018 @ [address]  Lizzet Hendley, [place]  03/26/2018 11:45 PM

## 2018-03-26 NOTE — MAU Note (Signed)
PT SAYS UC HURT BAD  SINCE LAST NIGHT  AT 10PM. [facility]- IN [place].  NO VE .  DENIES HSV AND MRSA. LAST SEX-  MAY

## 2018-03-26 NOTE — MAU Note (Signed)
I have communicated with Carloyn Jaeger. Dawson, CNM and reviewed vital signs:  Vitals:   03/26/18 2205 03/26/18 2352  BP: 118/73 119/75  Pulse: (!) 111   Resp: 20   Temp: 98.1 F (36.7 C)     Vaginal exam:  Dilation: Closed Effacement (%): Thick Exam by:: Zenia ResidesNikki Risheq, RN ,   Also reviewed contraction pattern and that non-stress test is reactive.  It has been documented that patient is contracting every irregularly and has a closed cervix not indicating active labor.  Patient denies any other complaints.  Based on this report provider has given order for discharge.  A discharge order and diagnosis entered by a provider.   Labor discharge instructions reviewed with patient.

## 2018-03-27 ENCOUNTER — Ambulatory Visit (INDEPENDENT_AMBULATORY_CARE_PROVIDER_SITE_OTHER): Payer: Medicaid Other | Admitting: Obstetrics & Gynecology

## 2018-03-27 ENCOUNTER — Other Ambulatory Visit (HOSPITAL_COMMUNITY)
Admission: RE | Admit: 2018-03-27 | Discharge: 2018-03-27 | Disposition: A | Discharge status: Home / Self Care | Payer: Medicaid Other | Source: Ambulatory Visit | Attending: Obstetrics & Gynecology | Admitting: Obstetrics & Gynecology

## 2018-03-27 VITALS — BP 119/74 | HR 110 | Wt 176.0 lb

## 2018-03-27 DIAGNOSIS — Z3A00 Weeks of gestation of pregnancy not specified: Secondary | ICD-10-CM | POA: Insufficient documentation

## 2018-03-27 DIAGNOSIS — Z3400 Encounter for supervision of normal first pregnancy, unspecified trimester: Secondary | ICD-10-CM

## 2018-03-27 DIAGNOSIS — Z3480 Encounter for supervision of other normal pregnancy, unspecified trimester: Secondary | ICD-10-CM

## 2018-03-27 LAB — OB RESULTS CONSOLE GC/CHLAMYDIA: Gonorrhea: NEGATIVE

## 2018-03-27 LAB — OB RESULTS CONSOLE GBS: STREP GROUP B AG: NEGATIVE

## 2018-03-27 NOTE — Progress Notes (Signed)
   PRENATAL VISIT NOTE  Subjective:  Patricia Cole is a 22 y.o. G1P0 at 2776w1d being seen today for ongoing prenatal care.  She is currently monitored for the following issues for this low-risk pregnancy and has HSV-1 infection; Internal derangement of right knee; Patellofemoral pain syndrome; Right knee pain; Supervision of other normal pregnancy, antepartum; Pelvic pain; Nausea/vomiting in pregnancy; and False labor on their problem list.  Patient reports no complaints.  Contractions: Irritability. Vag. Bleeding: None.  Movement: Present. Denies leaking of fluid.   The following portions of the patient's history were reviewed and updated as appropriate: allergies, current medications, past family history, past medical history, past social history, past surgical history and problem list. Problem list updated.  Objective:   Vitals:   03/27/18 1624  BP: 119/74  Pulse: (!) 110  Weight: 176 lb (79.8 kg)    Fetal Status:     Movement: Present     General:  Alert, oriented and cooperative. Patient is in no acute distress.  Skin: Skin is warm and dry. No rash noted.   Cardiovascular: Normal heart rate noted  Respiratory: Normal respiratory effort, no problems with respiration noted  Abdomen: Soft, gravid, appropriate for gestational age.  Pain/Pressure: Present     Pelvic: Cervical exam deferred        Extremities: Normal range of motion.  Edema: Trace  Mental Status: Normal mood and affect. Normal behavior. Normal judgment and thought content.   Assessment and Plan:  Pregnancy: G1P0 at 276w1d  1. Supervision of normal first pregnancy, antepartum  - Culture, beta strep (group b only) - Urine cytology ancillary only - Cervicovaginal ancillary only  2. Supervision of other normal pregnancy, antepartum - She and her mother seem concerned that she won't be able to have the baby vaginally as the patient's mother and grandmother both had cesarean sections. I tried to reassure them.    Preterm labor symptoms and general obstetric precautions including but not limited to vaginal bleeding, contractions, leaking of fluid and fetal movement were reviewed in detail with the patient. Please refer to After Visit Summary for other counseling recommendations.  No follow-ups on file.  No future appointments.  Patricia BossierMyra C Trevonn Hallum, MD

## 2018-03-28 ENCOUNTER — Encounter: Payer: Self-pay | Admitting: *Deleted

## 2018-03-29 LAB — URINE CYTOLOGY ANCILLARY ONLY
Chlamydia: NEGATIVE
Neisseria Gonorrhea: NEGATIVE

## 2018-03-30 LAB — CULTURE, BETA STREP (GROUP B ONLY)
MICRO NUMBER:: 90841246
SPECIMEN QUALITY: ADEQUATE

## 2018-04-06 ENCOUNTER — Ambulatory Visit (INDEPENDENT_AMBULATORY_CARE_PROVIDER_SITE_OTHER): Payer: Medicaid Other | Admitting: Advanced Practice Midwife

## 2018-04-06 ENCOUNTER — Encounter: Payer: Self-pay | Admitting: Advanced Practice Midwife

## 2018-04-06 DIAGNOSIS — Z3480 Encounter for supervision of other normal pregnancy, unspecified trimester: Secondary | ICD-10-CM

## 2018-04-06 DIAGNOSIS — Z3483 Encounter for supervision of other normal pregnancy, third trimester: Secondary | ICD-10-CM

## 2018-04-06 NOTE — Patient Instructions (Addendum)
AREA PEDIATRIC/FAMILY PRACTICE PHYSICIANS  [facility] [address], [address] [place], [place]  [address] Phone - [phone]   Fax - [phone]  [facility] [address] [place], [place] [address] Phone - [phone]   Fax - [phone]  JACK AMOS [address] [place], [place]  [address] Phone - [phone]   Fax - [phone]  [facility] [address] [place], [place]  [address] Phone - [phone]   Fax - [phone]  [facility] [address] [place], [place]  [address] Phone - [phone]   Fax - [phone]  [facility] [address], [address] [place], [place]  [address] Phone - [phone]   Fax - [phone]  [facility] [address], [address] [place], [place]  [address] Phone - [phone]   Fax - [phone]  [facility] [address] [place], [place]  [address] Phone - [phone]   Fax - [phone]  [facility] [address] [place], [place]  [address] Phone - [phone]   Fax - [phone] [facility] [address] [place], [place]  [address] Phone - [phone]   Fax - [phone]  [facility] [address] [place], [place]  [address] Phone - [phone]   Fax - [phone]  [facility] [address], [address] [place], [place]  [address] Phone - [phone]   Fax - [phone]  [facility] [address] [place], [place]  [address] Phone - [phone]   Fax - [phone]  [facility] [address], [address] [place], [place]  [address] Phone - [phone]  [facility] [address] [place], [place]  [address] Phone - [phone]   Fax - [phone]  [facility] [address], [address] [place], [place]  [address] Phone - [phone]   Fax - [phone]  [facility] [address] [place], [place]  [address] Phone - [phone]   Fax - [phone]  [facility] [address] [place], [place]  [address] Phone - [phone]   Fax - [phone]   [facility] [address], [address] [place], [place]  [address] Phone - [phone]   Fax - [phone]  [facility] [address], [address] [place], [place]  [address] Phone - [phone]   Fax - [phone]  DAVID RUBIN [address] [place], [place]  [address] Phone - [phone]   Fax - [phone]  [facility] [address] [place], [place]  [address] Phone - [phone]   Fax - [phone]  [facility] [address] [place], [place]  [address] Phone - [phone]   Fax - [phone] [facility] [address] [place], [place]  [address] Phone - [phone]   Fax - [phone]  [facility] [address] [place], [place]  [address] Phone - [phone]   Fax - [phone]  [facility] [place] [address] [place], [place]  [address] Phone - [phone]   Fax - [phone]  [facility] Wyvonne Lenzharlene Flemming MD [address] [place] [address] Phone [phone]  Fax [phone]    Patricia PeltonBraxton Hicks Contractions Contractions of the uterus can occur throughout pregnancy, but they  are not always a sign that you are in labor. You may have practice contractions called Braxton Hicks contractions. These false labor contractions are sometimes confused with true labor. What are Patricia PeltonBraxton Hicks contractions? Braxton Hicks contractions are tightening movements that occur in the muscles of the uterus before labor. Unlike true labor contractions, these contractions do not result in  opening (dilation) and thinning of the cervix. Toward the end of pregnancy (32-34 weeks), Braxton Hicks contractions can happen more often and may become stronger. These contractions are sometimes difficult to tell apart from true labor because they can be very uncomfortable. You should not feel embarrassed if you go to the hospital with false labor. Sometimes, the only way to tell if you are in true labor is for your health care provider to look for changes in the cervix. The health care provider will do a physical exam and may monitor your contractions. If you are not in true labor, the exam should show that your cervix is not dilating and your water has not broken. If there are other health problems associated with your pregnancy, it is completely safe for you to be sent home with false labor. You may continue to have Braxton Hicks contractions until you go into true labor. How to tell the difference between true labor and false labor True labor  Contractions last 30-70 seconds.  Contractions become very regular.  Discomfort is usually felt in the top of the uterus, and it spreads to the lower abdomen and low back.  Contractions do not go away with walking.  Contractions usually become more intense and increase in frequency.  The cervix dilates and gets thinner. False labor  Contractions are usually shorter and not as strong as true labor contractions.  Contractions are usually irregular.  Contractions are often felt in the front of the lower abdomen and in the groin.  Contractions may go away when you walk around or change positions while lying down.  Contractions get weaker and are shorter-lasting as time goes on.  The cervix usually does not dilate or become thin. Follow these instructions at home:  Take over-the-counter and prescription medicines only as told by your health care provider.  Keep up with your usual exercises and follow other instructions from your health care  provider.  Eat and drink lightly if you think you are going into labor.  If Braxton Hicks contractions are making you uncomfortable: ? Change your position from lying down or resting to walking, or change from walking to resting. ? Sit and rest in a tub of warm water. ? Drink enough fluid to keep your urine pale yellow. Dehydration may cause these contractions. ? Do slow and deep breathing several times an hour.  Keep all follow-up prenatal visits as told by your health care provider. This is important. Contact a health care provider if:  You have a fever.  You have continuous pain in your abdomen. Get help right away if:  Your contractions become stronger, more regular, and closer together.  You have fluid leaking or gushing from your vagina.  You pass blood-tinged mucus (bloody show).  You have bleeding from your vagina.  You have low back pain that you never had before.  You feel your baby's head pushing down and causing pelvic pressure.  Your baby is not moving inside you as much as it used to. Summary  Contractions that occur before labor are called Braxton Hicks contractions, false labor, or practice contractions.  Braxton Hicks contractions are  usually shorter, weaker, farther apart, and less regular than true labor contractions. True labor contractions usually become progressively stronger and regular and they become more frequent.  Manage discomfort from [facility] contractions by changing position, resting in a warm bath, drinking plenty of water, or practicing deep breathing. This information is not intended to replace advice given to you by your health care provider. Make sure you discuss any questions you have with your health care provider. Document Released: 01/12/2017 Document Revised: 01/12/2017 Document Reviewed: 01/12/2017 Elsevier Interactive Patient Education  2018 [employer]

## 2018-04-06 NOTE — Progress Notes (Signed)
   PRENATAL VISIT NOTE  Subjective:  Marcelle OverlieKaitlyn E Adduci is a 22 y.o. G1P0 at [redacted]w[redacted]d being seen today for ongoing prenatal care.  She is currently monitored for the following issues for this low-risk pregnancy and has HSV-1 infection; Internal derangement of right knee; Patellofemoral pain syndrome; Right knee pain; Supervision of other normal pregnancy, antepartum; Pelvic pain; Nausea/vomiting in pregnancy; and False labor on their problem list.  Patient reports occasional contractions.  Contractions: Irregular. Vag. Bleeding: None.  Movement: Present. Denies leaking of fluid.   The following portions of the patient's history were reviewed and updated as appropriate: allergies, current medications, past family history, past medical history, past social history, past surgical history and problem list. Problem list updated.  Objective:   Vitals:   04/06/18 1118  BP: 129/82  Pulse: (!) 115  Weight: 176 lb (79.8 kg)    Fetal Status: Fetal Heart Rate (bpm): 158 Fundal Height: 37 cm Movement: Present  Presentation: Vertex  General:  Alert, oriented and cooperative. Patient is in no acute distress.  Skin: Skin is warm and dry. No rash noted.   Cardiovascular: Normal heart rate noted  Respiratory: Normal respiratory effort, no problems with respiration noted  Abdomen: Soft, gravid, appropriate for gestational age.  Pain/Pressure: Present     Pelvic: Cervical exam deferred        Extremities: Normal range of motion.  Edema: Mild pitting, slight indentation  Mental Status: Normal mood and affect. Normal behavior. Normal judgment and thought content.   Assessment and Plan:  Pregnancy: G1P0 at 23375w4d  1. Supervision of other normal pregnancy, antepartum  Term labor symptoms and general obstetric precautions including but not limited to vaginal bleeding, contractions, leaking of fluid and fetal movement were reviewed in detail with the patient. Please refer to After Visit Summary for other  counseling recommendations.  Return in about 1 week (around 04/13/2018) for ROB.  No future appointments.  Dorathy KinsmanVirginia Indio Santilli, CNM

## 2018-04-09 ENCOUNTER — Inpatient Hospital Stay (HOSPITAL_COMMUNITY)
Admission: AD | Admit: 2018-04-09 | Discharge: 2018-04-09 | Disposition: A | Discharge status: Home / Self Care | Payer: Medicaid Other | Source: Ambulatory Visit | Attending: Obstetrics and Gynecology | Admitting: Obstetrics and Gynecology

## 2018-04-09 ENCOUNTER — Encounter (HOSPITAL_COMMUNITY): Payer: Self-pay

## 2018-04-09 ENCOUNTER — Other Ambulatory Visit: Payer: Self-pay

## 2018-04-09 DIAGNOSIS — O26893 Other specified pregnancy related conditions, third trimester: Secondary | ICD-10-CM

## 2018-04-09 DIAGNOSIS — R102 Pelvic and perineal pain: Secondary | ICD-10-CM | POA: Diagnosis not present

## 2018-04-09 DIAGNOSIS — Z3A38 38 weeks gestation of pregnancy: Secondary | ICD-10-CM | POA: Diagnosis not present

## 2018-04-09 DIAGNOSIS — Z3493 Encounter for supervision of normal pregnancy, unspecified, third trimester: Secondary | ICD-10-CM

## 2018-04-09 DIAGNOSIS — N898 Other specified noninflammatory disorders of vagina: Secondary | ICD-10-CM | POA: Diagnosis present

## 2018-04-09 DIAGNOSIS — Z3689 Encounter for other specified antenatal screening: Secondary | ICD-10-CM

## 2018-04-09 LAB — POCT FERN TEST: POCT Fern Test: NEGATIVE

## 2018-04-09 NOTE — Discharge Instructions (Signed)
Vaginal Delivery Vaginal delivery means that you will give birth by pushing your baby out of your birth canal (vagina). A team of health care providers will help you before, during, and after vaginal delivery. Birth experiences are unique for every woman and every pregnancy, and birth experiences vary depending on where you choose to give birth. What should I do to prepare for my baby's birth? Before your baby is born, it is important to talk with your health care provider about:  Your labor and delivery preferences. These may include: ? Medicines that you may be given. ? How you will manage your pain. This might include non-medical pain relief techniques or injectable pain relief such as epidural analgesia. ? How you and your baby will be monitored during labor and delivery. ? Who may be in the labor and delivery room with you. ? Your feelings about surgical delivery of your baby (cesarean delivery, or C-section) if this becomes necessary. ? Your feelings about receiving donated blood through an IV tube (blood transfusion) if this becomes necessary.  Whether you are able: ? To take pictures or videos of the birth. ? To eat during labor and delivery. ? To move around, walk, or change positions during labor and delivery.  What to expect after your baby is born, such as: ? Whether delayed umbilical cord clamping and cutting is offered. ? Who will care for your baby right after birth. ? Medicines or tests that may be recommended for your baby. ? Whether breastfeeding is supported in your hospital or birth center. ? How long you will be in the hospital or birth center.  How any medical conditions you have may affect your baby or your labor and delivery experience.  To prepare for your baby's birth, you should also:  Attend all of your health care visits before delivery (prenatal visits) as recommended by your health care provider. This is important.  Prepare your home for your baby's  arrival. Make sure that you have: ? Diapers. ? Baby clothing. ? Feeding equipment. ? Safe sleeping arrangements for you and your baby.  Install a car seat in your vehicle. Have your car seat checked by a certified car seat installer to make sure that it is installed safely.  Think about who will help you with your new baby at home for at least the first several weeks after delivery.  What can I expect when I arrive at the birth center or hospital? Once you are in labor and have been admitted into the hospital or birth center, your health care provider may:  Review your pregnancy history and any concerns you have.  Insert an IV tube into one of your veins. This is used to give you fluids and medicines.  Check your blood pressure, pulse, temperature, and heart rate (vital signs).  Check whether your bag of water (amniotic sac) has broken (ruptured).  Talk with you about your birth plan and discuss pain control options.  Monitoring Your health care provider may monitor your contractions (uterine monitoring) and your baby's heart rate (fetal monitoring). You may need to be monitored:  Often, but not continuously (intermittently).  All the time or for long periods at a time (continuously). Continuous monitoring may be needed if: ? You are taking certain medicines, such as medicine to relieve pain or make your contractions stronger. ? You have pregnancy or labor complications.  Monitoring may be done by:  Placing a special stethoscope or a handheld monitoring device on your abdomen to   check your baby's heartbeat, and feeling your abdomen for contractions. This method of monitoring does not continuously record your baby's heartbeat or your contractions.  Placing monitors on your abdomen (external monitors) to record your baby's heartbeat and the frequency and length of contractions. You may not have to wear external monitors all the time.  Placing monitors inside of your uterus  (internal monitors) to record your baby's heartbeat and the frequency, length, and strength of your contractions. ? Your health care provider may use internal monitors if he or she needs more information about the strength of your contractions or your baby's heart rate. ? Internal monitors are put in place by passing a thin, flexible wire through your vagina and into your uterus. Depending on the type of monitor, it may remain in your uterus or on your baby's head until birth. ? Your health care provider will discuss the benefits and risks of internal monitoring with you and will ask for your permission before inserting the monitors.  Telemetry. This is a type of continuous monitoring that can be done with external or internal monitors. Instead of having to stay in bed, you are able to move around during telemetry. Ask your health care provider if telemetry is an option for you.  Physical exam Your health care provider may perform a physical exam. This may include:  Checking whether your baby is positioned: ? With the head toward your vagina (head-down). This is most common. ? With the head toward the top of your uterus (head-up or breech). If your baby is in a breech position, your health care provider may try to turn your baby to a head-down position so you can deliver vaginally. If it does not seem that your baby can be born vaginally, your provider may recommend surgery to deliver your baby. In rare cases, you may be able to deliver vaginally if your baby is head-up (breech delivery). ? Lying sideways (transverse). Babies that are lying sideways cannot be delivered vaginally.  Checking your cervix to determine: ? Whether it is thinning out (effacing). ? Whether it is opening up (dilating). ? How low your baby has moved into your birth canal.  What are the three stages of labor and delivery?  Normal labor and delivery is divided into the following three stages: Stage 1  Stage 1 is the  longest stage of labor, and it can last for hours or days. Stage 1 includes: ? Early labor. This is when contractions may be irregular, or regular and mild. Generally, early labor contractions are more than 10 minutes apart. ? Active labor. This is when contractions get longer, more regular, more frequent, and more intense. ? The transition phase. This is when contractions happen very close together, are very intense, and may last longer than during any other part of labor.  Contractions generally feel mild, infrequent, and irregular at first. They get stronger, more frequent (about every 2-3 minutes), and more regular as you progress from early labor through active labor and transition.  Many women progress through stage 1 naturally, but you may need help to continue making progress. If this happens, your health care provider may talk with you about: ? Rupturing your amniotic sac if it has not ruptured yet. ? Giving you medicine to help make your contractions stronger and more frequent.  Stage 1 ends when your cervix is completely dilated to 4 inches (10 cm) and completely effaced. This happens at the end of the transition phase. Stage 2  Once   your cervix is completely effaced and dilated to 4 inches (10 cm), you may start to feel an urge to push. It is common for the body to naturally take a rest before feeling the urge to push, especially if you received an epidural or certain other pain medicines. This rest period may last for up to 1-2 hours, depending on your unique labor experience.  During stage 2, contractions are generally less painful, because pushing helps relieve contraction pain. Instead of contraction pain, you may feel stretching and burning pain, especially when the widest part of your baby's head passes through the vaginal opening (crowning).  Your health care provider will closely monitor your pushing progress and your baby's progress through the vagina during stage 2.  Your  health care provider may massage the area of skin between your vaginal opening and anus (perineum) or apply warm compresses to your perineum. This helps it stretch as the baby's head starts to crown, which can help prevent perineal tearing. ? In some cases, an incision may be made in your perineum (episiotomy) to allow the baby to pass through the vaginal opening. An episiotomy helps to make the opening of the vagina larger to allow more room for the baby to fit through.  It is very important to breathe and focus so your health care provider can control the delivery of your baby's head. Your health care provider may have you decrease the intensity of your pushing, to help prevent perineal tearing.  After delivery of your baby's head, the shoulders and the rest of the body generally deliver very quickly and without difficulty.  Once your baby is delivered, the umbilical cord may be cut right away, or this may be delayed for 1-2 minutes, depending on your baby's health. This may vary among health care providers, hospitals, and birth centers.  If you and your baby are healthy enough, your baby may be placed on your chest or abdomen to help maintain the baby's temperature and to help you bond with each other. Some mothers and babies start breastfeeding at this time. Your health care team will dry your baby and help keep your baby warm during this time.  Your baby may need immediate care if he or she: ? Showed signs of distress during labor. ? Has a medical condition. ? Was born too early (prematurely). ? Had a bowel movement before birth (meconium). ? Shows signs of difficulty transitioning from being inside the uterus to being outside of the uterus. If you are planning to breastfeed, your health care team will help you begin a feeding. Stage 3  The third stage of labor starts immediately after the birth of your baby and ends after you deliver the placenta. The placenta is an organ that develops  during pregnancy to provide oxygen and nutrients to your baby in the womb.  Delivering the placenta may require some pushing, and you may have mild contractions. Breastfeeding can stimulate contractions to help you deliver the placenta.  After the placenta is delivered, your uterus should tighten (contract) and become firm. This helps to stop bleeding in your uterus. To help your uterus contract and to control bleeding, your health care provider may: ? Give you medicine by injection, through an IV tube, by mouth, or through your rectum (rectally). ? Massage your abdomen or perform a vaginal exam to remove any blood clots that are left in your uterus. ? Empty your bladder by placing a thin, flexible tube (catheter) into your bladder. ? Encourage   you to breastfeed your baby. After labor is over, you and your baby will be monitored closely to ensure that you are both healthy until you are ready to go home. Your health care team will teach you how to care for yourself and your baby. This information is not intended to replace advice given to you by your health care provider. Make sure you discuss any questions you have with your health care provider. Document Released: 06/07/2008 Document Revised: 03/18/2016 Document Reviewed: 09/13/2015 Elsevier Interactive Patient Education  2018 [employer]vier Inc.  

## 2018-04-09 NOTE — MAU Provider Note (Signed)
  History     CSN: [id number]  Arrival date and time: 04/09/18 1705   None     Chief Complaint  Patient presents with  . Back Pain  . Pelvic Pain  . Rupture of Membranes   HPI  Patricia Cole is a 22 y.o. G1P0 at 3463w0d who presents to MAU with chief complaint of increased watery vaginal discharge, new onset three hours ago.  Also reporting mild bilateral low abdominal cramping. "Crampy", 4/10, does not radiated, no aggravating or alleviating factors.  Denies vaginal bleeding,  decreased fetal movement, fever, falls, or recent illness.  Denies urinary symptoms  OB History    Gravida  1   Para      Term      Preterm      AB      Living        SAB      TAB      Ectopic      Multiple      Live Births              Past Medical History:  Diagnosis Date  . Medical history non-contributory   . Seasonal allergies     Past Surgical History:  Procedure Laterality Date  . WISDOM TOOTH EXTRACTION      Family History  Problem Relation Age of Onset  . Breast cancer Maternal Grandmother   . Breast cancer Paternal Grandmother     Social History   Tobacco Use  . Smoking status: Never Smoker  . Smokeless tobacco: Never Used  Substance Use Topics  . Alcohol use: No  . Drug use: No    Allergies: No Known Allergies  Medications Prior to Admission  Medication Sig Dispense Refill Last Dose  . Prenatal Vit-Fe Fumarate-FA (PRENATAL MULTIVITAMIN) TABS tablet Take 1 tablet by mouth daily at 12 noon.   Taking    Review of Systems  Respiratory: Negative for chest tightness and shortness of breath.   Gastrointestinal: Negative for nausea and vomiting.  Genitourinary: Positive for vaginal discharge. Negative for vaginal bleeding and vaginal pain.  Musculoskeletal: Negative for back pain.  All other systems reviewed and are negative.  Physical Exam   Blood pressure 129/77, pulse (!) 109, temperature 98.6 F (37 C), temperature source Oral, resp. rate 20,  weight 179 lb 4 oz (81.3 kg), SpO2 99 %.  Physical Exam  Nursing note and vitals reviewed. Constitutional: She appears well-developed and well-nourished.  Cardiovascular: Normal rate, regular rhythm, normal heart sounds and intact distal pulses.  Genitourinary: Uterus normal. Vaginal discharge found.  Genitourinary Comments: Closed internally/thick/posterior Thick white vaginal discharge noted on SSE    MAU Course  Procedures  MDM --negative pooling --negative ferning --reactive NST: baseline 145, moderate variability, positive accelerations, no decelerations --toco: occasional irregular contractions , palpate mild --closed cervix  Assessment and Plan  --22 y.o. G1P0 at 6963w0d  --intact amniotic sac --reactive NST --discharge home in stable condition, continue routine care  Calvert CantorSamantha C Weinhold, CNM 04/09/2018, 6:49 PM

## 2018-04-09 NOTE — MAU Note (Signed)
Ever since yesterday, has been having a lot of pain in low back and pelvis.  Having increased urge to pee, but when sits down to pee, - she has tries to push - and nothing comes out.  About 2 hrs ago, started having a watery d/c. Having some infrequent, irregular contractions.  Knows she is breathing, but feels like she can't get a deep breath, ? Anxious.

## 2018-04-09 NOTE — MAU Note (Signed)
Urine sent to lab 

## 2018-04-10 ENCOUNTER — Ambulatory Visit (INDEPENDENT_AMBULATORY_CARE_PROVIDER_SITE_OTHER): Payer: Medicaid Other | Admitting: Obstetrics & Gynecology

## 2018-04-10 DIAGNOSIS — Z3403 Encounter for supervision of normal first pregnancy, third trimester: Secondary | ICD-10-CM

## 2018-04-10 DIAGNOSIS — Z3480 Encounter for supervision of other normal pregnancy, unspecified trimester: Secondary | ICD-10-CM

## 2018-04-11 ENCOUNTER — Encounter (HOSPITAL_COMMUNITY): Payer: Self-pay

## 2018-04-11 ENCOUNTER — Inpatient Hospital Stay (HOSPITAL_COMMUNITY)
Admission: AD | Admit: 2018-04-11 | Discharge: 2018-04-12 | Disposition: A | Discharge status: Home / Self Care | Payer: Medicaid Other | Source: Ambulatory Visit | Attending: Family Medicine | Admitting: Family Medicine

## 2018-04-11 DIAGNOSIS — Z3A40 40 weeks gestation of pregnancy: Secondary | ICD-10-CM | POA: Insufficient documentation

## 2018-04-11 DIAGNOSIS — O479 False labor, unspecified: Secondary | ICD-10-CM

## 2018-04-11 DIAGNOSIS — Z3483 Encounter for supervision of other normal pregnancy, third trimester: Secondary | ICD-10-CM | POA: Insufficient documentation

## 2018-04-11 NOTE — MAU Note (Signed)
Pt reports a lot of back and pelvic pain that started 3 hours ago. Having some painful irregular contractions. States she had some spotting earlier but none now. Reports good fetal movement. Pt states cervix was examined yesterday and was closed.

## 2018-04-11 NOTE — Discharge Instructions (Signed)
Braxton Hicks Contractions °Contractions of the uterus can occur throughout pregnancy, but they are not always a sign that you are in labor. You may have practice contractions called Braxton Hicks contractions. These false labor contractions are sometimes confused with true labor. °What are Braxton Hicks contractions? °Braxton Hicks contractions are tightening movements that occur in the muscles of the uterus before labor. Unlike true labor contractions, these contractions do not result in opening (dilation) and thinning of the cervix. Toward the end of pregnancy (32-34 weeks), Braxton Hicks contractions can happen more often and may become stronger. These contractions are sometimes difficult to tell apart from true labor because they can be very uncomfortable. You should not feel embarrassed if you go to the hospital with false labor. °Sometimes, the only way to tell if you are in true labor is for your health care provider to look for changes in the cervix. The health care provider will do a physical exam and may monitor your contractions. If you are not in true labor, the exam should show that your cervix is not dilating and your water has not broken. °If there are other health problems associated with your pregnancy, it is completely safe for you to be sent home with false labor. You may continue to have Braxton Hicks contractions until you go into true labor. °How to tell the difference between true labor and false labor °True labor °· Contractions last 30-70 seconds. °· Contractions become very regular. °· Discomfort is usually felt in the top of the uterus, and it spreads to the lower abdomen and low back. °· Contractions do not go away with walking. °· Contractions usually become more intense and increase in frequency. °· The cervix dilates and gets thinner. °False labor °· Contractions are usually shorter and not as strong as true labor contractions. °· Contractions are usually irregular. °· Contractions  are often felt in the front of the lower abdomen and in the groin. °· Contractions may go away when you walk around or change positions while lying down. °· Contractions get weaker and are shorter-lasting as time goes on. °· The cervix usually does not dilate or become thin. °Follow these instructions at home: °· Take over-the-counter and prescription medicines only as told by your health care provider. °· Keep up with your usual exercises and follow other instructions from your health care provider. °· Eat and drink lightly if you think you are going into labor. °· If Braxton Hicks contractions are making you uncomfortable: °? Change your position from lying down or resting to walking, or change from walking to resting. °? Sit and rest in a tub of warm water. °? Drink enough fluid to keep your urine pale yellow. Dehydration may cause these contractions. °? Do slow and deep breathing several times an hour. °· Keep all follow-up prenatal visits as told by your health care provider. This is important. °Contact a health care provider if: °· You have a fever. °· You have continuous pain in your abdomen. °Get help right away if: °· Your contractions become stronger, more regular, and closer together. °· You have fluid leaking or gushing from your vagina. °· You pass blood-tinged mucus (bloody show). °· You have bleeding from your vagina. °· You have low back pain that you never had before. °· You feel your baby’s head pushing down and causing pelvic pressure. °· Your baby is not moving inside you as much as it used to. °Summary °· Contractions that occur before labor are called Braxton   Hicks contractions, false labor, or practice contractions. °· Braxton Hicks contractions are usually shorter, weaker, farther apart, and less regular than true labor contractions. True labor contractions usually become progressively stronger and regular and they become more frequent. °· Manage discomfort from Braxton Hicks contractions by  changing position, resting in a warm bath, drinking plenty of water, or practicing deep breathing. °This information is not intended to replace advice given to you by your health care provider. Make sure you discuss any questions you have with your health care provider. °Document Released: 01/12/2017 Document Revised: 01/12/2017 Document Reviewed: 01/12/2017 °Elsevier Interactive Patient Education © 2018 [employer] ° °

## 2018-04-11 NOTE — Progress Notes (Signed)
   PRENATAL VISIT NOTE  Subjective:  Patricia Cole is a 22 y.o. G1P0 at 5168w2d being seen today for ongoing prenatal care.  She is currently monitored for the following issues for this low-risk pregnancy and has HSV-1 infection; Internal derangement of right knee; Patellofemoral pain syndrome; Right knee pain; Supervision of other normal pregnancy, antepartum; Pelvic pain; and Nausea/vomiting in pregnancy on their problem list.  Patient reports backache.  Contractions: Irregular. Vag. Bleeding: None.  Movement: Present. Denies leaking of fluid.   The following portions of the patient's history were reviewed and updated as appropriate: allergies, current medications, past family history, past medical history, past social history, past surgical history and problem list. Problem list updated.  Objective:   Vitals:   04/10/18 1628  BP: 131/78  Pulse: (!) 102  Weight: 181 lb (82.1 kg)    Fetal Status: Fetal Heart Rate (bpm): 155   Movement: Present     General:  Alert, oriented and cooperative. Patient is in no acute distress.  Skin: Skin is warm and dry. No rash noted.   Cardiovascular: Normal heart rate noted  Respiratory: Normal respiratory effort, no problems with respiration noted  Abdomen: Soft, gravid, appropriate for gestational age.  Pain/Pressure: Present     Pelvic: Cervical exam deferred        Extremities: Normal range of motion.  Edema: Mild pitting, slight indentation  Mental Status: Normal mood and affect. Normal behavior. Normal judgment and thought content.   Assessment and Plan:  Pregnancy: G1P0 at 23 [redacted]w[redacted]d  1. Supervision of other normal pregnancy, antepartum No issues; ready for labor to begin.  Back pain.  Still working. R/o for rupture yesterday  Term labor symptoms and general obstetric precautions including but not limited to vaginal bleeding, contractions, leaking of fluid and fetal movement were reviewed in detail with the patient. Please refer to After  Visit Summary for other counseling recommendations.  Return in about 1 week (around 04/17/2018).  Future Appointments  Date Time Provider Department Center  04/16/2018 11:15 AM Aviva SignsWilliams, Marie L, CNM [facility]    Elsie LincolnKelly Leggett, MD

## 2018-04-12 ENCOUNTER — Ambulatory Visit (INDEPENDENT_AMBULATORY_CARE_PROVIDER_SITE_OTHER): Payer: Medicaid Other | Admitting: Obstetrics & Gynecology

## 2018-04-12 ENCOUNTER — Encounter: Payer: Self-pay | Admitting: *Deleted

## 2018-04-12 VITALS — BP 136/83 | HR 100 | Wt 180.0 lb

## 2018-04-12 DIAGNOSIS — R102 Pelvic and perineal pain: Secondary | ICD-10-CM | POA: Diagnosis not present

## 2018-04-12 DIAGNOSIS — Z3483 Encounter for supervision of other normal pregnancy, third trimester: Secondary | ICD-10-CM | POA: Diagnosis not present

## 2018-04-12 DIAGNOSIS — Z3480 Encounter for supervision of other normal pregnancy, unspecified trimester: Secondary | ICD-10-CM

## 2018-04-12 DIAGNOSIS — Z3A40 40 weeks gestation of pregnancy: Secondary | ICD-10-CM | POA: Diagnosis not present

## 2018-04-12 DIAGNOSIS — B009 Herpesviral infection, unspecified: Secondary | ICD-10-CM

## 2018-04-12 DIAGNOSIS — O9989 Other specified diseases and conditions complicating pregnancy, childbirth and the puerperium: Secondary | ICD-10-CM

## 2018-04-12 NOTE — Progress Notes (Signed)
   PRENATAL VISIT NOTE  Subjective:  Marcelle OverlieKaitlyn E Raphael is a 22 y.o. G1P0 at 9320w3d being seen today for ongoing prenatal care.  She is currently monitored for the following issues for this low-risk pregnancy and has HSV-1 infection; Internal derangement of right knee; Patellofemoral pain syndrome; Right knee pain; Supervision of other normal pregnancy, antepartum; Pelvic pain; and Nausea/vomiting in pregnancy on their problem list.  Patient reports pt reports pain from ctx and just generalized pregnancy pains. .  Contractions: Irregular. Vag. Bleeding: None.  Movement: Present. Denies leaking of fluid.   The following portions of the patient's history were reviewed and updated as appropriate: allergies, current medications, past family history, past medical history, past social history, past surgical history and problem list. Problem list updated.  Objective:   Vitals:   04/12/18 1428  BP: 136/83  Pulse: 100  Weight: 180 lb (81.6 kg)    Fetal Status:     Movement: Present     General:  Alert, oriented and cooperative. Patient is in no acute distress.  Skin: Skin is warm and dry. No rash noted.   Cardiovascular: Normal heart rate noted  Respiratory: Normal respiratory effort, no problems with respiration noted  Abdomen: Soft, gravid, appropriate for gestational age.  Pain/Pressure: Present     Pelvic: Cervical exam performed        Extremities: Normal range of motion.  Edema: Mild pitting, slight indentation  Mental Status: Normal mood and affect. Normal behavior. Normal judgment and thought content.   Assessment and Plan:  Pregnancy: G1P0 at 4220w3d  1. Supervision of other normal pregnancy, antepartum D/w pt labor precautions   2. Pelvic pain  3. HSV-1 infection  Term labor symptoms and general obstetric precautions including but not limited to vaginal bleeding, contractions, leaking of fluid and fetal movement were reviewed in detail with the patient. Please refer to After  Visit Summary for other counseling recommendations.  Return in about 1 week (around 04/19/2018).  Future Appointments  Date Time Provider Department Center  04/16/2018 11:15 AM Aviva SignsWilliams, Marie L, CNM [facility]    Willodean Rosenthalarolyn Harraway-Smith, MD

## 2018-04-12 NOTE — Progress Notes (Signed)
PT c/o increased pressure and back pain

## 2018-04-12 NOTE — MAU Note (Signed)
I have communicated with Philipp DeputyKim Shaw, CNM and reviewed vital signs:  Vitals:   04/11/18 2224 04/11/18 2333  BP: 129/81 124/73  Pulse: 100 92  Resp: 20   Temp: 98 F (36.7 C)   SpO2: 100%     Vaginal exam:  Dilation: Closed Cervical Position: Posterior Exam by:: Camelia Enganielle Simpson, RN ,   Also reviewed contraction pattern and that non-stress test is reactive.  It has been documented that the patient has only uterine irritability not indicating active labor.  Patient denies any other complaints.  Based on this report provider has given order for discharge.  A discharge order and diagnosis entered by a provider.   Labor discharge instructions reviewed with patient.

## 2018-04-15 ENCOUNTER — Encounter: Payer: Self-pay | Admitting: Obstetrics & Gynecology

## 2018-04-16 ENCOUNTER — Ambulatory Visit (INDEPENDENT_AMBULATORY_CARE_PROVIDER_SITE_OTHER): Payer: Medicaid Other | Admitting: Advanced Practice Midwife

## 2018-04-16 ENCOUNTER — Encounter: Payer: Self-pay | Admitting: Advanced Practice Midwife

## 2018-04-16 VITALS — BP 132/84 | HR 114 | Wt 181.0 lb

## 2018-04-16 DIAGNOSIS — Z3480 Encounter for supervision of other normal pregnancy, unspecified trimester: Secondary | ICD-10-CM

## 2018-04-16 NOTE — Progress Notes (Signed)
Pt c/o increased pressure & back pain

## 2018-04-16 NOTE — Progress Notes (Signed)
PRENATAL VISIT NOTE  Subjective:  Patricia Cole is a 22 y.o. G1P0 at [redacted]w[redacted]d being seen today for ongoing prenatal care.  She is currently monitored for the following issues for this low-risk pregnancy and has HSV-1 infection; Internal derangement of right knee; Patellofemoral pain syndrome; Right knee pain; Supervision of other normal pregnancy, antepartum; Pelvic pain; and Nausea/vomiting in pregnancy on their problem list.  Patient reports occasional contractions.  Contractions: Irregular. Vag. Bleeding: None.  Movement: Present. Denies leaking of fluid. States has been walking and dancing but "nothing works".    The following portions of the patient's history were reviewed and updated as appropriate: allergies, current medications, past family history, past medical history, past social history, past surgical history and problem list. Problem list updated.  Objective:   Vitals:   04/16/18 1101  BP: 132/84  Pulse: (!) 114  Weight: 181 lb (82.1 kg)    Fetal Status: Fetal Heart Rate (bpm): 150   Movement: Present     General:  Alert, oriented and cooperative. Patient is in no acute distress.  Skin: Skin is warm and dry. No rash noted.   Cardiovascular: Normal heart rate noted  Respiratory: Normal respiratory effort, no problems with respiration noted  Abdomen: Soft, gravid, appropriate for gestational age.  Pain/Pressure: Present     Pelvic: Cervical exam performed      1cm/50%/-3/vtx  Membranes swept per request of patient  Extremities: Normal range of motion.  Edema: Mild pitting, slight indentation  Mental Status: Normal mood and affect. Normal behavior. Normal judgment and thought content.   Assessment and Plan:  Pregnancy: G1P0 at [redacted]w[redacted]d  Term labor symptoms and general obstetric precautions including but not limited to vaginal bleeding, contractions, leaking of fluid and fetal movement were reviewed in detail with the patient.  Discussed labor happens without stimulation.   OK to walk but don't overdo activity.  Membranes swept per request   Please refer to After Visit Summary for other counseling recommendations.  No follow-ups on file.  Future Appointments  Date Time Provider Department Center  04/23/2018 10:30 AM Donette Larry, CNM [facility]    Wynelle Bourgeois, CNM  PRENATAL VISIT NOTE  Subjective:  Patricia Cole is a 22 y.o. G1P0 at [redacted]w[redacted]d being seen today for ongoing prenatal care.  She is currently monitored for the following issues for this low-risk pregnancy and has HSV-1 infection; Internal derangement of right knee; Patellofemoral pain syndrome; Right knee pain; Supervision of other normal pregnancy, antepartum; Pelvic pain; and Nausea/vomiting in pregnancy on their problem list.  Patient reports occasional contractions.  Contractions: Irregular. Vag. Bleeding: None.  Movement: Present. Denies leaking of fluid.   The following portions of the patient's history were reviewed and updated as appropriate: allergies, current medications, past family history, past medical history, past social history, past surgical history and problem list. Problem list updated.  Objective:   Vitals:   04/16/18 1101  BP: 132/84  Pulse: (!) 114  Weight: 181 lb (82.1 kg)    Fetal Status: Fetal Heart Rate (bpm): 150   Movement: Present     General:  Alert, oriented and cooperative. Patient is in no acute distress.  Skin: Skin is warm and dry. No rash noted.   Cardiovascular: Normal heart rate noted  Respiratory: Normal respiratory effort, no problems with respiration noted  Abdomen: Soft, gravid, appropriate for gestational age.  Pain/Pressure: Present     Pelvic: Cervical exam performed        Extremities: Normal range of  motion.  Edema: Mild pitting, slight indentation  Mental Status: Normal mood and affect. Normal behavior. Normal judgment and thought content.   Assessment and Plan:  Pregnancy: G1P0 at 2323w0d  There are no diagnoses linked  to this encounter. Term labor symptoms and general obstetric precautions including but not limited to vaginal bleeding, contractions, leaking of fluid and fetal movement were reviewed in detail with the patient. Please refer to After Visit Summary for other counseling recommendations.  No follow-ups on file.  Future Appointments  Date Time Provider Department Center  04/23/2018 10:30 AM Donette LarryBhambri, Melanie, CNM [facility]    Wynelle BourgeoisMarie Kaleyah Labreck, CNM

## 2018-04-16 NOTE — Patient Instructions (Signed)

## 2018-04-17 ENCOUNTER — Inpatient Hospital Stay (HOSPITAL_COMMUNITY)
Admission: AD | Admit: 2018-04-17 | Discharge: 2018-04-17 | Disposition: A | Discharge status: Home / Self Care | Payer: Medicaid Other | Source: Ambulatory Visit | Attending: Obstetrics & Gynecology | Admitting: Obstetrics & Gynecology

## 2018-04-17 ENCOUNTER — Encounter (HOSPITAL_COMMUNITY): Payer: Self-pay | Admitting: *Deleted

## 2018-04-17 DIAGNOSIS — O219 Vomiting of pregnancy, unspecified: Secondary | ICD-10-CM

## 2018-04-17 DIAGNOSIS — O368130 Decreased fetal movements, third trimester, not applicable or unspecified: Secondary | ICD-10-CM | POA: Diagnosis not present

## 2018-04-17 DIAGNOSIS — R300 Dysuria: Secondary | ICD-10-CM | POA: Diagnosis present

## 2018-04-17 DIAGNOSIS — O471 False labor at or after 37 completed weeks of gestation: Secondary | ICD-10-CM | POA: Diagnosis not present

## 2018-04-17 DIAGNOSIS — O4693 Antepartum hemorrhage, unspecified, third trimester: Secondary | ICD-10-CM

## 2018-04-17 DIAGNOSIS — Z3A39 39 weeks gestation of pregnancy: Secondary | ICD-10-CM | POA: Diagnosis not present

## 2018-04-17 DIAGNOSIS — O210 Mild hyperemesis gravidarum: Secondary | ICD-10-CM

## 2018-04-17 DIAGNOSIS — N989 Complication associated with artificial fertilization, unspecified: Secondary | ICD-10-CM | POA: Diagnosis not present

## 2018-04-17 DIAGNOSIS — O26893 Other specified pregnancy related conditions, third trimester: Secondary | ICD-10-CM | POA: Diagnosis not present

## 2018-04-17 DIAGNOSIS — Z3480 Encounter for supervision of other normal pregnancy, unspecified trimester: Secondary | ICD-10-CM

## 2018-04-17 DIAGNOSIS — N939 Abnormal uterine and vaginal bleeding, unspecified: Secondary | ICD-10-CM | POA: Diagnosis present

## 2018-04-17 DIAGNOSIS — O468X3 Other antepartum hemorrhage, third trimester: Secondary | ICD-10-CM | POA: Diagnosis not present

## 2018-04-17 DIAGNOSIS — R102 Pelvic and perineal pain: Secondary | ICD-10-CM

## 2018-04-17 LAB — URINALYSIS, ROUTINE W REFLEX MICROSCOPIC
BILIRUBIN URINE: NEGATIVE
GLUCOSE, UA: 150 mg/dL — AB
Ketones, ur: NEGATIVE mg/dL
Nitrite: NEGATIVE
Protein, ur: NEGATIVE mg/dL
Specific Gravity, Urine: 1.025 (ref 1.005–1.030)
pH: 6 (ref 5.0–8.0)

## 2018-04-17 MED ORDER — ONDANSETRON 4 MG PO TBDP
4.0000 mg | ORAL_TABLET | Freq: Once | ORAL | Status: AC
  Administered 2018-04-17: 4 mg via ORAL
  Filled 2018-04-17: qty 1

## 2018-04-17 NOTE — MAU Note (Signed)
Pt reports they stripped her membranes yesterday and had some spotting afterwards. Today has had heavier bleeding and passing clots. Also reports she has not voided since 10 am even though she feels like she needs to.

## 2018-04-17 NOTE — Discharge Instructions (Signed)
Vaginal Delivery Vaginal delivery means that you will give birth by pushing your baby out of your birth canal (vagina). A team of health care providers will help you before, during, and after vaginal delivery. Birth experiences are unique for every woman and every pregnancy, and birth experiences vary depending on where you choose to give birth. What should I do to prepare for my baby's birth? Before your baby is born, it is important to talk with your health care provider about:  Your labor and delivery preferences. These may include: ? Medicines that you may be given. ? How you will manage your pain. This might include non-medical pain relief techniques or injectable pain relief such as epidural analgesia. ? How you and your baby will be monitored during labor and delivery. ? Who may be in the labor and delivery room with you. ? Your feelings about surgical delivery of your baby (cesarean delivery, or C-section) if this becomes necessary. ? Your feelings about receiving donated blood through an IV tube (blood transfusion) if this becomes necessary.  Whether you are able: ? To take pictures or videos of the birth. ? To eat during labor and delivery. ? To move around, walk, or change positions during labor and delivery.  What to expect after your baby is born, such as: ? Whether delayed umbilical cord clamping and cutting is offered. ? Who will care for your baby right after birth. ? Medicines or tests that may be recommended for your baby. ? Whether breastfeeding is supported in your hospital or birth center. ? How long you will be in the hospital or birth center.  How any medical conditions you have may affect your baby or your labor and delivery experience.  To prepare for your baby's birth, you should also:  Attend all of your health care visits before delivery (prenatal visits) as recommended by your health care provider. This is important.  Prepare your home for your baby's  arrival. Make sure that you have: ? Diapers. ? Baby clothing. ? Feeding equipment. ? Safe sleeping arrangements for you and your baby.  Install a car seat in your vehicle. Have your car seat checked by a certified car seat installer to make sure that it is installed safely.  Think about who will help you with your new baby at home for at least the first several weeks after delivery.  What can I expect when I arrive at the birth center or hospital? Once you are in labor and have been admitted into the hospital or birth center, your health care provider may:  Review your pregnancy history and any concerns you have.  Insert an IV tube into one of your veins. This is used to give you fluids and medicines.  Check your blood pressure, pulse, temperature, and heart rate (vital signs).  Check whether your bag of water (amniotic sac) has broken (ruptured).  Talk with you about your birth plan and discuss pain control options.  Monitoring Your health care provider may monitor your contractions (uterine monitoring) and your baby's heart rate (fetal monitoring). You may need to be monitored:  Often, but not continuously (intermittently).  All the time or for long periods at a time (continuously). Continuous monitoring may be needed if: ? You are taking certain medicines, such as medicine to relieve pain or make your contractions stronger. ? You have pregnancy or labor complications.  Monitoring may be done by:  Placing a special stethoscope or a handheld monitoring device on your abdomen to   check your baby's heartbeat, and feeling your abdomen for contractions. This method of monitoring does not continuously record your baby's heartbeat or your contractions.  Placing monitors on your abdomen (external monitors) to record your baby's heartbeat and the frequency and length of contractions. You may not have to wear external monitors all the time.  Placing monitors inside of your uterus  (internal monitors) to record your baby's heartbeat and the frequency, length, and strength of your contractions. ? Your health care provider may use internal monitors if he or she needs more information about the strength of your contractions or your baby's heart rate. ? Internal monitors are put in place by passing a thin, flexible wire through your vagina and into your uterus. Depending on the type of monitor, it may remain in your uterus or on your baby's head until birth. ? Your health care provider will discuss the benefits and risks of internal monitoring with you and will ask for your permission before inserting the monitors.  Telemetry. This is a type of continuous monitoring that can be done with external or internal monitors. Instead of having to stay in bed, you are able to move around during telemetry. Ask your health care provider if telemetry is an option for you.  Physical exam Your health care provider may perform a physical exam. This may include:  Checking whether your baby is positioned: ? With the head toward your vagina (head-down). This is most common. ? With the head toward the top of your uterus (head-up or breech). If your baby is in a breech position, your health care provider may try to turn your baby to a head-down position so you can deliver vaginally. If it does not seem that your baby can be born vaginally, your provider may recommend surgery to deliver your baby. In rare cases, you may be able to deliver vaginally if your baby is head-up (breech delivery). ? Lying sideways (transverse). Babies that are lying sideways cannot be delivered vaginally.  Checking your cervix to determine: ? Whether it is thinning out (effacing). ? Whether it is opening up (dilating). ? How low your baby has moved into your birth canal.  What are the three stages of labor and delivery?  Normal labor and delivery is divided into the following three stages: Stage 1  Stage 1 is the  longest stage of labor, and it can last for hours or days. Stage 1 includes: ? Early labor. This is when contractions may be irregular, or regular and mild. Generally, early labor contractions are more than 10 minutes apart. ? Active labor. This is when contractions get longer, more regular, more frequent, and more intense. ? The transition phase. This is when contractions happen very close together, are very intense, and may last longer than during any other part of labor.  Contractions generally feel mild, infrequent, and irregular at first. They get stronger, more frequent (about every 2-3 minutes), and more regular as you progress from early labor through active labor and transition.  Many women progress through stage 1 naturally, but you may need help to continue making progress. If this happens, your health care provider may talk with you about: ? Rupturing your amniotic sac if it has not ruptured yet. ? Giving you medicine to help make your contractions stronger and more frequent.  Stage 1 ends when your cervix is completely dilated to 4 inches (10 cm) and completely effaced. This happens at the end of the transition phase. Stage 2  Once   your cervix is completely effaced and dilated to 4 inches (10 cm), you may start to feel an urge to push. It is common for the body to naturally take a rest before feeling the urge to push, especially if you received an epidural or certain other pain medicines. This rest period may last for up to 1-2 hours, depending on your unique labor experience.  During stage 2, contractions are generally less painful, because pushing helps relieve contraction pain. Instead of contraction pain, you may feel stretching and burning pain, especially when the widest part of your baby's head passes through the vaginal opening (crowning).  Your health care provider will closely monitor your pushing progress and your baby's progress through the vagina during stage 2.  Your  health care provider may massage the area of skin between your vaginal opening and anus (perineum) or apply warm compresses to your perineum. This helps it stretch as the baby's head starts to crown, which can help prevent perineal tearing. ? In some cases, an incision may be made in your perineum (episiotomy) to allow the baby to pass through the vaginal opening. An episiotomy helps to make the opening of the vagina larger to allow more room for the baby to fit through.  It is very important to breathe and focus so your health care provider can control the delivery of your baby's head. Your health care provider may have you decrease the intensity of your pushing, to help prevent perineal tearing.  After delivery of your baby's head, the shoulders and the rest of the body generally deliver very quickly and without difficulty.  Once your baby is delivered, the umbilical cord may be cut right away, or this may be delayed for 1-2 minutes, depending on your baby's health. This may vary among health care providers, hospitals, and birth centers.  If you and your baby are healthy enough, your baby may be placed on your chest or abdomen to help maintain the baby's temperature and to help you bond with each other. Some mothers and babies start breastfeeding at this time. Your health care team will dry your baby and help keep your baby warm during this time.  Your baby may need immediate care if he or she: ? Showed signs of distress during labor. ? Has a medical condition. ? Was born too early (prematurely). ? Had a bowel movement before birth (meconium). ? Shows signs of difficulty transitioning from being inside the uterus to being outside of the uterus. If you are planning to breastfeed, your health care team will help you begin a feeding. Stage 3  The third stage of labor starts immediately after the birth of your baby and ends after you deliver the placenta. The placenta is an organ that develops  during pregnancy to provide oxygen and nutrients to your baby in the womb.  Delivering the placenta may require some pushing, and you may have mild contractions. Breastfeeding can stimulate contractions to help you deliver the placenta.  After the placenta is delivered, your uterus should tighten (contract) and become firm. This helps to stop bleeding in your uterus. To help your uterus contract and to control bleeding, your health care provider may: ? Give you medicine by injection, through an IV tube, by mouth, or through your rectum (rectally). ? Massage your abdomen or perform a vaginal exam to remove any blood clots that are left in your uterus. ? Empty your bladder by placing a thin, flexible tube (catheter) into your bladder. ? Encourage   you to breastfeed your baby. After labor is over, you and your baby will be monitored closely to ensure that you are both healthy until you are ready to go home. Your health care team will teach you how to care for yourself and your baby. This information is not intended to replace advice given to you by your health care provider. Make sure you discuss any questions you have with your health care provider. Document Released: 06/07/2008 Document Revised: 03/18/2016 Document Reviewed: 09/13/2015 Elsevier Interactive Patient Education  2018 [employer]vier Inc.  

## 2018-04-17 NOTE — MAU Provider Note (Signed)
History     CSN: [id number]  Arrival date and time: 04/17/18 1353   First Provider Initiated Contact with Patient 04/17/18 1455      Chief Complaint  Patient presents with  . Vaginal Bleeding  . Dysuria   HPI  Patricia Cole is a 22 y.o. G1P0 at 7429w1d who presents to MAU with chief complaint of vaginal bleeding with small clots in the setting of having her membranes swept yesterday at clinic. Reports she has urge to void but was unable to void from 10am to 2pm. Denies  leaking of fluid, decreased fetal movement, fever, falls, or recent illness.    OB History    Gravida  1   Para      Term      Preterm      AB      Living        SAB      TAB      Ectopic      Multiple      Live Births              Past Medical History:  Diagnosis Date  . Medical history non-contributory   . Seasonal allergies     Past Surgical History:  Procedure Laterality Date  . WISDOM TOOTH EXTRACTION      Family History  Problem Relation Age of Onset  . Breast cancer Maternal Grandmother   . Breast cancer Paternal Grandmother     Social History   Tobacco Use  . Smoking status: Never Smoker  . Smokeless tobacco: Never Used  Substance Use Topics  . Alcohol use: No  . Drug use: No    Allergies: No Known Allergies  Medications Prior to Admission  Medication Sig Dispense Refill Last Dose  . Prenatal Vit-Fe Fumarate-FA (PRENATAL MULTIVITAMIN) TABS tablet Take 1 tablet by mouth daily at 12 noon.   Not Taking    Review of Systems  Respiratory: Negative for shortness of breath.   Gastrointestinal: Positive for nausea and vomiting. Negative for abdominal pain.  Genitourinary: Positive for vaginal bleeding. Negative for vaginal discharge and vaginal pain.  Neurological: Negative for dizziness and light-headedness.  All other systems reviewed and are negative.  Physical Exam   Blood pressure 129/77, pulse (!) 114, temperature 98.6 F (37 C), temperature source Oral,  resp. rate 18, height 5' 1.5" (1.562 m), weight 181 lb (82.1 kg), SpO2 100 %.  Physical Exam  Nursing note and vitals reviewed. Constitutional: She is oriented to person, place, and time. She appears well-nourished.  Cardiovascular: Normal rate, regular rhythm, normal heart sounds and intact distal pulses.  Respiratory: Effort normal and breath sounds normal.  GI:  Gravid  Genitourinary:  Genitourinary Comments: FT/thick/posterior  Neurological: She is alert and oriented to person, place, and time. She has normal reflexes.  Skin: Skin is warm and dry.  Psychiatric: She has a normal mood and affect. Her behavior is normal. Judgment and thought content normal.    MAU Course  Procedures  MDM --Reactive NST: baseline 140s, moderate variability, positive accelerations, no decelerations --Toco: irregular mild contractions --Intermittent nausea --Elevated WBCs and trace Leukocytes on UA, contaminated clean catch, urine culture ordered --Bladder scan 90 mL, successfully voided in MAU --Discussed fetal positioning may impact urge to void  Patient Vitals for the past 24 hrs:  BP Temp Temp src Pulse Resp SpO2 Height Weight  04/17/18 1406 129/77 98.6 F (37 C) Oral (!) 114 18 100 % 5' 1.5" (1.562 m) 181  lb (82.1 kg)    Orders Placed This Encounter  Procedures  . Urine culture  . Urinalysis, Routine w reflex microscopic   Results for orders placed or performed during the hospital encounter of 04/17/18 (from the past 24 hour(s))  Urinalysis, Routine w reflex microscopic     Status: Abnormal   Collection Time: 04/17/18  3:14 PM  Result Value Ref Range   Color, Urine YELLOW YELLOW   APPearance CLOUDY (A) CLEAR   Specific Gravity, Urine 1.025 1.005 - 1.030   pH 6.0 5.0 - 8.0   Glucose, UA 150 (A) NEGATIVE mg/dL   Hgb urine dipstick MODERATE (A) NEGATIVE   Bilirubin Urine NEGATIVE NEGATIVE   Ketones, ur NEGATIVE NEGATIVE mg/dL   Protein, ur NEGATIVE NEGATIVE mg/dL   Nitrite NEGATIVE  NEGATIVE   Leukocytes, UA TRACE (A) NEGATIVE   RBC / HPF 0-5 0 - 5 RBC/hpf   WBC, UA 11-20 0 - 5 WBC/hpf   Bacteria, UA RARE (A) NONE SEEN   Squamous Epithelial / LPF >50 (H) 0 - 5   Mucus PRESENT    Meds ordered this encounter  Medications  . ondansetron (ZOFRAN-ODT) disintegrating tablet 4 mg     Assessment and Plan  --22 y.o. G1P0 at [redacted]w[redacted]d  --Reactive NST --Spotting related to cervical exam --Able to void in MAU --Reviewed general obstetric precautions including but not limited to falls, fever, vaginal bleeding, leaking of fluid, decreased fetal movement, headache not relieved by Tylenol, rest and PO hydration. --Discharge home in stable condition  F/U [facility] 04/23/2018  Calvert Cantor, CNM 04/17/2018, 4:16 PM

## 2018-04-17 NOTE — MAU Note (Signed)
Bladder scan shows 90cc

## 2018-04-18 ENCOUNTER — Encounter (HOSPITAL_COMMUNITY): Payer: Self-pay

## 2018-04-18 ENCOUNTER — Inpatient Hospital Stay (HOSPITAL_COMMUNITY)
Admission: AD | Admit: 2018-04-18 | Discharge: 2018-04-18 | Disposition: A | Discharge status: Home / Self Care | Payer: Medicaid Other | Source: Ambulatory Visit | Attending: Obstetrics & Gynecology | Admitting: Obstetrics & Gynecology

## 2018-04-18 DIAGNOSIS — N898 Other specified noninflammatory disorders of vagina: Secondary | ICD-10-CM | POA: Diagnosis present

## 2018-04-18 DIAGNOSIS — Z3A39 39 weeks gestation of pregnancy: Secondary | ICD-10-CM | POA: Insufficient documentation

## 2018-04-18 DIAGNOSIS — O479 False labor, unspecified: Secondary | ICD-10-CM

## 2018-04-18 DIAGNOSIS — O26893 Other specified pregnancy related conditions, third trimester: Secondary | ICD-10-CM | POA: Insufficient documentation

## 2018-04-18 DIAGNOSIS — O471 False labor at or after 37 completed weeks of gestation: Secondary | ICD-10-CM | POA: Diagnosis not present

## 2018-04-18 DIAGNOSIS — N989 Complication associated with artificial fertilization, unspecified: Secondary | ICD-10-CM

## 2018-04-18 DIAGNOSIS — O368130 Decreased fetal movements, third trimester, not applicable or unspecified: Secondary | ICD-10-CM

## 2018-04-18 LAB — URINE CULTURE

## 2018-04-18 LAB — POCT FERN TEST: POCT FERN TEST: NEGATIVE — AB

## 2018-04-18 NOTE — MAU Provider Note (Signed)
Chief Complaint:  Rupture of Membranes   First Provider Initiated Contact with Patient 04/18/18 2203      HPI: Patricia OverlieKaitlyn E Cole is a 22 y.o. G1P0 at 5839w2dwho presents to maternity admissions reporting Leaking of fluid since 1800hrs.  States it was Slow trickling and wetness on clothing  She reports good fetal movement, denies vaginal itching/burning, urinary symptoms, h/a, dizziness, n/v, or fever/chills.  Also reports decreased fetal movement  Vaginal Discharge  The patient's primary symptoms include vaginal bleeding and vaginal discharge. The patient's pertinent negatives include no genital itching, genital lesions or genital odor. This is a new problem. The current episode started today. The problem occurs intermittently. The problem has been resolved. She is pregnant. Pertinent negatives include no back pain, chills, constipation, diarrhea, dysuria, fever, nausea or vomiting. The vaginal discharge was clear. The vaginal bleeding is spotting. She has not been passing clots. She has not been passing tissue. Nothing aggravates the symptoms. She has tried nothing for the symptoms.   RN Note: Patient states she felt wet around 1800 then noticed some bright red bleeding at approximately 2030. Last felt baby move around lunchtime.  Also feeling pressure.  Past Medical History: Past Medical History:  Diagnosis Date  . Medical history non-contributory   . Seasonal allergies     Past obstetric history: OB History  Gravida Para Term Preterm AB Living  1            SAB TAB Ectopic Multiple Live Births               # Outcome Date GA Lbr Len/2nd Weight Sex Delivery Anes PTL Lv  1 Current             Past Surgical History: Past Surgical History:  Procedure Laterality Date  . WISDOM TOOTH EXTRACTION      Family History: Family History  Problem Relation Age of Onset  . Breast cancer Maternal Grandmother   . Breast cancer Paternal Grandmother     Social History: Social History    Tobacco Use  . Smoking status: Never Smoker  . Smokeless tobacco: Never Used  Substance Use Topics  . Alcohol use: No  . Drug use: No    Allergies: No Known Allergies  Meds:  Medications Prior to Admission  Medication Sig Dispense Refill Last Dose  . Prenatal Vit-Fe Fumarate-FA (PRENATAL MULTIVITAMIN) TABS tablet Take 1 tablet by mouth daily at 12 noon.   Not Taking    ROS:  Review of Systems  Constitutional: Negative for chills and fever.  Gastrointestinal: Negative for constipation, diarrhea, nausea and vomiting.  Genitourinary: Positive for vaginal discharge. Negative for dysuria.  Musculoskeletal: Negative for back pain.   I have reviewed patient's Past Medical Hx, Surgical Hx, Family Hx, Social Hx, medications and allergies.   Physical Exam   Patient Vitals for the past 24 hrs:  BP Temp Pulse Resp Height  04/18/18 2123 130/75 98.1 F (36.7 C) (!) 112 17 5' 1.5" (1.562 m)   Constitutional: Well-developed, well-nourished female in no acute distress.  Cardiovascular: normal rate and rhythm Respiratory: normal effort, no distress GI: Abd soft, non-tender, gravid appropriate for gestational age.  MS: Extremities nontender, no edema, normal ROM Neurologic: Alert and oriented x 4.  GU: Neg CVAT.  PELVIC EXAM: Cervix pink, visually closed, without lesion, scant white creamy discharge, vaginal walls and external genitalia normal  Dilation: 1 Effacement (%): 80 Station: -3 Presentation: Vertex Exam by:: Wynelle BourgeoisMarie Kingstin Heims, CNM  FHT:  Baseline 140 ,  moderate variability, accelerations present, no decelerations Contractions: Irregular   Labs: Results for orders placed or performed during the hospital encounter of 04/18/18 (from the past 24 hour(s))  Fern Test     Status: Abnormal   Collection Time: 04/18/18  9:51 PM  Result Value Ref Range   POCT Fern Test Negative = intact amniotic membranes (NE)       Imaging:  No results found.  MAU Course/MDM: I have Done  a speculum exam and fern test which was Negative.  Pooling was negative Nitrazine not done.    Pt stable at time of discharge.  Assessment: Single intrauterine pregnancy at [redacted]w[redacted]d Vaginal discharge, no evidence of ruptured membranes Spotting  Plan: Discharge home Labor precautions and fetal kick counts  Encouraged to return here or to other Urgent Care/ED if she develops worsening of symptoms, increase in pain, fever, or other concerning symptoms.   Wynelle Bourgeois CNM, MSN Certified Nurse-Midwife 04/18/2018 10:03 PM

## 2018-04-18 NOTE — MAU Note (Signed)
Patient states she felt wet around 1800 then noticed some bright red bleeding at approximately 2030. Last felt baby move around lunchtime.  Also feeling pressure.

## 2018-04-18 NOTE — MAU Note (Signed)
Patient reports feeling baby move at least 10 times since being on the monitor.

## 2018-04-23 ENCOUNTER — Inpatient Hospital Stay (HOSPITAL_COMMUNITY): Payer: Medicaid Other | Admitting: Anesthesiology

## 2018-04-23 ENCOUNTER — Encounter (HOSPITAL_COMMUNITY): Payer: Self-pay

## 2018-04-23 ENCOUNTER — Encounter: Payer: Medicaid Other | Admitting: Certified Nurse Midwife

## 2018-04-23 ENCOUNTER — Inpatient Hospital Stay (HOSPITAL_COMMUNITY)
Admission: AD | Admit: 2018-04-23 | Discharge: 2018-04-25 | DRG: 805 | Disposition: A | Discharge status: Home / Self Care | Payer: Medicaid Other | Attending: Obstetrics and Gynecology | Admitting: Obstetrics and Gynecology

## 2018-04-23 ENCOUNTER — Other Ambulatory Visit: Payer: Self-pay

## 2018-04-23 DIAGNOSIS — O411230 Chorioamnionitis, third trimester, not applicable or unspecified: Secondary | ICD-10-CM | POA: Diagnosis present

## 2018-04-23 DIAGNOSIS — Z3A40 40 weeks gestation of pregnancy: Secondary | ICD-10-CM | POA: Diagnosis not present

## 2018-04-23 DIAGNOSIS — R102 Pelvic and perineal pain: Secondary | ICD-10-CM

## 2018-04-23 DIAGNOSIS — Z3480 Encounter for supervision of other normal pregnancy, unspecified trimester: Secondary | ICD-10-CM

## 2018-04-23 DIAGNOSIS — O9832 Other infections with a predominantly sexual mode of transmission complicating childbirth: Secondary | ICD-10-CM | POA: Diagnosis present

## 2018-04-23 DIAGNOSIS — A6000 Herpesviral infection of urogenital system, unspecified: Secondary | ICD-10-CM | POA: Diagnosis present

## 2018-04-23 DIAGNOSIS — O4292 Full-term premature rupture of membranes, unspecified as to length of time between rupture and onset of labor: Principal | ICD-10-CM | POA: Diagnosis present

## 2018-04-23 DIAGNOSIS — O219 Vomiting of pregnancy, unspecified: Secondary | ICD-10-CM

## 2018-04-23 DIAGNOSIS — O4290 Premature rupture of membranes, unspecified as to length of time between rupture and onset of labor, unspecified weeks of gestation: Secondary | ICD-10-CM | POA: Diagnosis present

## 2018-04-23 LAB — RPR: RPR: NONREACTIVE

## 2018-04-23 LAB — TYPE AND SCREEN
ABO/RH(D): AB POS
Antibody Screen: NEGATIVE

## 2018-04-23 LAB — CBC
HCT: 33.9 % — ABNORMAL LOW (ref 36.0–46.0)
Hemoglobin: 11.1 g/dL — ABNORMAL LOW (ref 12.0–15.0)
MCH: 27.4 pg (ref 26.0–34.0)
MCHC: 32.7 g/dL (ref 30.0–36.0)
MCV: 83.7 fL (ref 78.0–100.0)
PLATELETS: 343 10*3/uL (ref 150–400)
RBC: 4.05 MIL/uL (ref 3.87–5.11)
RDW: 15.1 % (ref 11.5–15.5)
WBC: 16.2 10*3/uL — AB (ref 4.0–10.5)

## 2018-04-23 LAB — ABO/RH: ABO/RH(D): AB POS

## 2018-04-23 LAB — AMNISURE RUPTURE OF MEMBRANE (ROM) NOT AT ARMC: Amnisure ROM: POSITIVE

## 2018-04-23 LAB — POCT FERN TEST: POCT Fern Test: NEGATIVE

## 2018-04-23 MED ORDER — ONDANSETRON HCL 4 MG/2ML IJ SOLN
4.0000 mg | Freq: Four times a day (QID) | INTRAMUSCULAR | Status: DC | PRN
  Administered 2018-04-23: 4 mg via INTRAVENOUS
  Filled 2018-04-23 (×2): qty 2

## 2018-04-23 MED ORDER — FLEET ENEMA 7-19 GM/118ML RE ENEM: 1.0000 | ENEMA | RECTAL | Status: DC | PRN

## 2018-04-23 MED ORDER — OXYTOCIN 40 UNITS IN LACTATED RINGERS INFUSION - SIMPLE MED
1.0000 m[IU]/min | INTRAVENOUS | Status: DC
  Administered 2018-04-23: 10 m[IU]/min via INTRAVENOUS
  Administered 2018-04-23: 8 m[IU]/min via INTRAVENOUS
  Administered 2018-04-23: 2 m[IU]/min via INTRAVENOUS
  Administered 2018-04-23: 6 m[IU]/min via INTRAVENOUS
  Administered 2018-04-23: 4 m[IU]/min via INTRAVENOUS
  Administered 2018-04-23: 12 m[IU]/min via INTRAVENOUS

## 2018-04-23 MED ORDER — OXYTOCIN 40 UNITS IN LACTATED RINGERS INFUSION - SIMPLE MED
2.5000 [IU]/h | INTRAVENOUS | Status: DC
  Filled 2018-04-23: qty 1000

## 2018-04-23 MED ORDER — FENTANYL CITRATE (PF) 100 MCG/2ML IJ SOLN
50.0000 ug | Freq: Once | INTRAMUSCULAR | Status: AC
  Administered 2018-04-23: 50 ug via INTRAVENOUS

## 2018-04-23 MED ORDER — OXYCODONE-ACETAMINOPHEN 5-325 MG PO TABS: 2.0000 | ORAL_TABLET | ORAL | Status: DC | PRN

## 2018-04-23 MED ORDER — FENTANYL CITRATE (PF) 100 MCG/2ML IJ SOLN
100.0000 ug | Freq: Once | INTRAMUSCULAR | Status: AC
  Administered 2018-04-23: 100 ug via INTRAVENOUS
  Filled 2018-04-23: qty 2

## 2018-04-23 MED ORDER — LACTATED RINGERS IV SOLN
INTRAVENOUS | Status: DC
  Administered 2018-04-23: 10:00:00 via INTRAVENOUS

## 2018-04-23 MED ORDER — EPHEDRINE 5 MG/ML INJ
10.0000 mg | INTRAVENOUS | Status: DC | PRN
  Filled 2018-04-23: qty 2

## 2018-04-23 MED ORDER — PHENYLEPHRINE 40 MCG/ML (10ML) SYRINGE FOR IV PUSH (FOR BLOOD PRESSURE SUPPORT)
80.0000 ug | PREFILLED_SYRINGE | INTRAVENOUS | Status: DC | PRN
  Filled 2018-04-23: qty 5

## 2018-04-23 MED ORDER — ACETAMINOPHEN 325 MG PO TABS: 650.0000 mg | ORAL_TABLET | ORAL | Status: DC | PRN

## 2018-04-23 MED ORDER — LIDOCAINE HCL (PF) 1 % IJ SOLN
30.0000 mL | INTRAMUSCULAR | Status: DC | PRN
  Administered 2018-04-23: 30 mL via SUBCUTANEOUS
  Filled 2018-04-23: qty 30

## 2018-04-23 MED ORDER — OXYCODONE-ACETAMINOPHEN 5-325 MG PO TABS: 1.0000 | ORAL_TABLET | ORAL | Status: DC | PRN

## 2018-04-23 MED ORDER — TERBUTALINE SULFATE 1 MG/ML IJ SOLN
0.2500 mg | Freq: Once | INTRAMUSCULAR | Status: DC | PRN
  Filled 2018-04-23: qty 1

## 2018-04-23 MED ORDER — PHENYLEPHRINE 40 MCG/ML (10ML) SYRINGE FOR IV PUSH (FOR BLOOD PRESSURE SUPPORT)
80.0000 ug | PREFILLED_SYRINGE | INTRAVENOUS | Status: DC | PRN
  Filled 2018-04-23: qty 10
  Filled 2018-04-23: qty 5

## 2018-04-23 MED ORDER — OXYTOCIN BOLUS FROM INFUSION
500.0000 mL | Freq: Once | INTRAVENOUS | Status: AC
  Administered 2018-04-23: 500 mL via INTRAVENOUS

## 2018-04-23 MED ORDER — LACTATED RINGERS IV SOLN: 500.0000 mL | Freq: Once | INTRAVENOUS | Status: DC

## 2018-04-23 MED ORDER — LACTATED RINGERS IV SOLN
500.0000 mL | INTRAVENOUS | Status: DC | PRN
  Administered 2018-04-23: 1000 mL via INTRAVENOUS

## 2018-04-23 MED ORDER — ACETAMINOPHEN 500 MG PO TABS
1000.0000 mg | ORAL_TABLET | ORAL | Status: DC | PRN
  Administered 2018-04-23: 1000 mg via ORAL
  Filled 2018-04-23: qty 2

## 2018-04-23 MED ORDER — FENTANYL 2.5 MCG/ML BUPIVACAINE 1/10 % EPIDURAL INFUSION (WH - ANES)
14.0000 mL/h | INTRAMUSCULAR | Status: DC | PRN
  Administered 2018-04-23 (×2): 14 mL/h via EPIDURAL
  Filled 2018-04-23 (×2): qty 100

## 2018-04-23 MED ORDER — DEXTROSE 5 % IV SOLN
150.0000 mg | Freq: Three times a day (TID) | INTRAVENOUS | Status: DC
Start: 2018-04-23 — End: 2018-04-24
  Administered 2018-04-23: 150 mg via INTRAVENOUS
  Filled 2018-04-23 (×2): qty 3.75

## 2018-04-23 MED ORDER — DIPHENHYDRAMINE HCL 50 MG/ML IJ SOLN: 12.5000 mg | INTRAMUSCULAR | Status: DC | PRN

## 2018-04-23 MED ORDER — SOD CITRATE-CITRIC ACID 500-334 MG/5ML PO SOLN: 30.0000 mL | ORAL | Status: DC | PRN

## 2018-04-23 MED ORDER — LIDOCAINE HCL (PF) 1 % IJ SOLN
INTRAMUSCULAR | Status: DC | PRN
  Administered 2018-04-23 (×2): 5 mL via EPIDURAL

## 2018-04-23 MED ORDER — SODIUM CHLORIDE 0.9 % IV SOLN
2.0000 g | Freq: Four times a day (QID) | INTRAVENOUS | Status: DC
  Administered 2018-04-23: 2 g via INTRAVENOUS
  Filled 2018-04-23: qty 2000
  Filled 2018-04-23: qty 2
  Filled 2018-04-23: qty 2000

## 2018-04-23 MED ORDER — FENTANYL CITRATE (PF) 100 MCG/2ML IJ SOLN
INTRAMUSCULAR | Status: AC
  Administered 2018-04-23: 50 ug via INTRAVENOUS
  Filled 2018-04-23: qty 2

## 2018-04-23 NOTE — Anesthesia Procedure Notes (Signed)
Epidural Patient location during procedure: OB  Staffing Anesthesiologist: Theona Muhs, MD Performed: anesthesiologist   Preanesthetic Checklist Completed: patient identified, site marked, surgical consent, pre-op evaluation, timeout performed, IV checked, risks and benefits discussed and monitors and equipment checked  Epidural Patient position: sitting Prep: DuraPrep Patient monitoring: heart rate, continuous pulse ox and blood pressure Approach: right paramedian Location: L3-L4 Injection technique: LOR saline  Needle:  Needle type: Tuohy  Needle gauge: 17 G Needle length: 9 cm and 9 Needle insertion depth: 6 cm Catheter type: closed end flexible Catheter size: 20 Guage Catheter at skin depth: 10 cm Test dose: negative  Assessment Events: blood not aspirated, injection not painful, no injection resistance, negative IV test and no paresthesia  Additional Notes Patient identified. Risks/Benefits/Options discussed with patient including but not limited to bleeding, infection, nerve damage, paralysis, failed block, incomplete pain control, headache, blood pressure changes, nausea, vomiting, reactions to medication both or allergic, itching and postpartum back pain. Confirmed with bedside nurse the patient's most recent platelet count. Confirmed with patient that they are not currently taking any anticoagulation, have any bleeding history or any family history of bleeding disorders. Patient expressed understanding and wished to proceed. All questions were answered. Sterile technique was used throughout the entire procedure. Please see nursing notes for vital signs. Test dose was given through epidural needle and negative prior to continuing to dose epidural or start infusion. Warning signs of high block given to the patient including shortness of breath, tingling/numbness in hands, complete motor block, or any concerning symptoms with instructions to call for help. Patient was given  instructions on fall risk and not to get out of bed. All questions and concerns addressed with instructions to call with any issues.     

## 2018-04-23 NOTE — Progress Notes (Signed)
Pharmacy Antibiotic Note  Patricia OverlieKaitlyn E Cole is a [age] y.o. female admitted on 04/23/2018 with SROM at 3971w0d gestation .  Pharmacy has been consulted for Gentamicin dosing for Triple I due to maternal tem during labor.   Plan: Gentamicin 150mg  IV q8h Will continue to follow and assess need for further labs based on duration and pt's clinical status  Height: 5\' 2"  (157.5 cm) Weight: 183 lb (83 kg) IBW/kg (Calculated) : 50.1  Adjusted/ Dosing BW: 60kg  Temp (24hrs), Avg:99.1 F (37.3 C), Min:97.9 F (36.6 C), Max:101.6 F (38.7 C)  Recent Labs  Lab 04/23/18 0814  WBC 16.2*    CrCl cannot be calculated (No successful lab value found.).   Estimated SCr= 0.38 with estimated CrCl ~ 17200ml/min  No Known Allergies  Antimicrobials this admission: Ampicillin 2 gram IV q6h 8/12 >>   Thank you for allowing pharmacy to be a part of this patient's care.  Patricia Jabsngel, Patricia Cole 04/23/2018 10:23 PM

## 2018-04-23 NOTE — H&P (Addendum)
OBSTETRIC ADMISSION HISTORY AND PHYSICAL  Patricia OverlieKaitlyn E Cole is a 22 y.o. female G1P0 with IUP at 4715w0d by early u/s presenting for PROM. Loss of fluid occurred at about 0345 this morning.  She sttes her contractions are a 7/10 in intensity and 6-7 minutes apart.  She reports no vaginal bleeding.  She denies blurry vision, headaches or peripheral edema, or RUQ pain. She has an HSV-1 infection. Her pregnancy was uncomplicated. She plans on breast feeding. She is unsure for birth control. [facility]   Sono:  Record unavailable.   Prenatal History/Complications: HSV-1 infection  Past Medical History: Past Medical History:  Diagnosis Date  . Medical history non-contributory   . Seasonal allergies     Past Surgical History: Past Surgical History:  Procedure Laterality Date  . WISDOM TOOTH EXTRACTION      Obstetrical History: OB History    Gravida  1   Para      Term      Preterm      AB      Living        SAB      TAB      Ectopic      Multiple      Live Births              Social History: Social History   Socioeconomic History  . Marital status: Married    Spouse name: Not on file  . Number of children: Not on file  . Years of education: Not on file  . Highest education level: Not on file  Occupational History  . Not on file  Social Needs  . Financial resource strain: Not very hard  . Food insecurity:    Worry: Never true    Inability: Never true  . Transportation needs:    Medical: Patient refused    Non-medical: Patient refused  Tobacco Use  . Smoking status: Never Smoker  . Smokeless tobacco: Never Used  Substance and Sexual Activity  . Alcohol use: No  . Drug use: No  . Sexual activity: Not Currently    Birth control/protection: None  Lifestyle  . Physical activity:    Days per week: 0 days    Minutes per session: 0 min  . Stress: Only a little  Relationships  . Social connections:    Talks on  phone: Once a week    Gets together: Once a week    Attends religious service: More than 4 times per year    Active member of club or organization: No    Attends meetings of clubs or organizations: Never    Relationship status: Married  Other Topics Concern  . Not on file  Social History Narrative  . Not on file    Family History: Family History  Problem Relation Age of Onset  . Breast cancer Maternal Grandmother   . Breast cancer Paternal Grandmother     Allergies: No Known Allergies  Medications Prior to Admission  Medication Sig Dispense Refill Last Dose  . Prenatal Vit-Fe Fumarate-FA (PRENATAL MULTIVITAMIN) TABS tablet Take 1 tablet by mouth daily at 12 noon.   04/23/2018 at Unknown time     Review of Systems   All systems reviewed and negative except as stated in HPI  Blood pressure 128/78, pulse 94, temperature 99.2 F (37.3 C), temperature source Axillary, resp. rate 20, height 5\' 2"  (1.575 m), weight 83 kg, SpO2 100 %. General appearance: alert, cooperative, appears  stated age and no distress Lungs: clear to auscultation bilaterally Heart: regular rate and rhythm Abdomen: gravid, non-tender; bowel sounds normal Extremities: Homans sign is negative, no sign of DVT Presentation: cephalic Fetal monitoringBaseline: 120 bpm, Variability: Good {> 6 bpm), Accelerations: Reactive and Decelerations: Absent Uterine activityDate/time of onset: this morning, Frequency: Every 6  minutes and Intensity: moderate Dilation: 2 Effacement (%): 80 Station: -3 Exam by:: katherine g jones RN    Prenatal labs: ABO, Rh: AB/Positive/-- (12/19 0000) Antibody: Negative (12/19 0000) Rubella: Immune (12/19 0000) RPR: NON-REACTIVE (05/31 0845)  HBsAg: Negative (12/19 0000)  HIV: NON-REACTIVE (05/31 0845)  GBS: Negative (07/16 0000)  1 hr Glucola 80/142/115 Genetic screening  declined Anatomy US normal  Prenatal Transfer Tool  Maternal Diabetes: No Genetic Screening:  Declined Maternal Ultrasounds/Referrals: Normal Fetal Ultrasounds or other Referrals:  None Maternal Substance Abuse:  No Significant Maternal Medications:  None Significant Maternal Lab Results: Lab values include: Group B Strep negative  Results for orders placed or performed during the hospital encounter of 04/23/18 (from the past 24 hour(s))  POCT fern test   Collection Time: 04/23/18  6:52 AM  Result Value Ref Range   POCT Fern Test Negative = intact amniotic membranes   [facility]   Collection Time: 04/23/18  7:00 AM  Result Value Ref Range   Amnisure ROM POSITIVE   CBC   Collection Time: 04/23/18  8:14 AM  Result Value Ref Range   WBC 16.2 (H) 4.0 - 10.5 K/uL   RBC 4.05 3.87 - 5.11 MIL/uL   Hemoglobin 11.1 (L) 12.0 - 15.0 g/dL   HCT 16.133.9 (L) 09.636.0 - 04.546.0 %   MCV 83.7 78.0 - 100.0 fL   MCH 27.4 26.0 - 34.0 pg   MCHC 32.7 30.0 - 36.0 g/dL   RDW 40.915.1 81.111.5 - 91.415.5 %   Platelets 343 150 - 400 K/uL    Patient Active Problem List   Diagnosis Date Noted  . PROM (premature rupture of membranes) 04/23/2018  . Pelvic pain 03/14/2018  . Nausea/vomiting in pregnancy 03/14/2018  . Supervision of other normal pregnancy, antepartum 01/16/2018  . HSV-1 infection 12/16/2015  . Internal derangement of right knee 05/29/2014  . Patellofemoral pain syndrome 05/29/2014  . Right knee pain 05/29/2014    Assessment/Plan:  Patricia Cole is a 22 y.o. G1P0 at 7270w0d here for PROM.  Complicated by HSV1 infection.   #Labor:expectant. Pitocin if necessary for inadequate contractions.     #Pain: Epidural available upon maternal request.   #FWB: Cat I.  Increased variability.  No decels.  Present accels.  130bpm.  #ID:  GBS neg #MOF: breast #MOC:unsure  Sandre Kittyaniel K Olson, MD  04/23/2018, 12:12 PM   OB FELLOW HISTORY AND PHYSICAL ATTESTATION  I have seen and examined this patient; I agree with above documentation in the resident's note.   Patricia OverlieKaitlyn E  Cole is 22 y.o. G1P0 at 8270w0d who presents for SROM with clear fluid around 3:45 am. Patient with history of HSV-1 noted in chart. She clarifies that she has never had genital lesions, just cold sores, and has no genital prodromal symptoms. No lesions noted by MAU RN during evaluation for ruptured membranes. Epidural now in place. Pitocin at 664mu/min. Plan for repeat cervical exam and adjustment of Pitocin as needed for adequate ctx pattern. Anticipate NSVD.   Marcy Sirenatherine Wallace, D.O. OB Fellow  04/23/2018, 2:27 PM

## 2018-04-23 NOTE — Anesthesia Pain Management Evaluation Note (Signed)
  CRNA Pain Management Visit Note  Patient: Patricia Cole, 22 y.o., female  "Hello [facility] am a [facility] the [facility]. We have an anesthesia team available at all times to provide care throughout the hospital, including epidural management and anesthesia for C-section. I don't know your plan for the delivery whether it a natural birth, water birth, IV sedation, nitrous supplementation, doula or epidural, but we want to meet your pain goals."   1.Was your pain managed to your expectations on prior hospitalizations?   No prior hospitalizations  2.What is your expectation for pain management during this hospitalization?     Epidural  3.How can we help you reach that goal? Be available, Epidural infusing, patient comfortable  Record the patient's initial score and the patient's pain goal.   Pain: 1  Pain Goal: 5 [facility] wants you to be able to say your pain was always managed very well.  [facility] HospitalMERRITT,Patricia Cole 04/23/2018

## 2018-04-23 NOTE — Progress Notes (Signed)
Pt informed that the ultrasound is considered a limited OB ultrasound and is not intended to be a complete ultrasound exam.  Patient also informed that the ultrasound is not being completed with the intent of assessing for fetal or placental anomalies or any pelvic abnormalities.  Explained that the purpose of today's ultrasound is to assess for  presentation.  Patient acknowledges the purpose of the exam and the limitations of the study.  [facility]   Kadia Abaya, NP    

## 2018-04-23 NOTE — Anesthesia Preprocedure Evaluation (Signed)

## 2018-04-23 NOTE — MAU Note (Signed)
Pt. States her water broke at 5am. Reports it was clear. Has had spotting since then. States she has ctx but irregularly. +FM

## 2018-04-24 DIAGNOSIS — Z3A40 40 weeks gestation of pregnancy: Secondary | ICD-10-CM

## 2018-04-24 MED ORDER — ACETAMINOPHEN 325 MG PO TABS: 650.0000 mg | ORAL_TABLET | ORAL | Status: DC | PRN

## 2018-04-24 MED ORDER — ONDANSETRON HCL 4 MG PO TABS: 4.0000 mg | ORAL_TABLET | ORAL | Status: DC | PRN

## 2018-04-24 MED ORDER — ONDANSETRON HCL 4 MG/2ML IJ SOLN: 4.0000 mg | INTRAMUSCULAR | Status: DC | PRN

## 2018-04-24 MED ORDER — TETANUS-DIPHTH-ACELL PERTUSSIS 5-2.5-18.5 LF-MCG/0.5 IM SUSP: 0.5000 mL | Freq: Once | INTRAMUSCULAR | Status: DC

## 2018-04-24 MED ORDER — IBUPROFEN 600 MG PO TABS
600.0000 mg | ORAL_TABLET | Freq: Four times a day (QID) | ORAL | Status: DC
  Administered 2018-04-24 – 2018-04-25 (×7): 600 mg via ORAL
  Filled 2018-04-24 (×7): qty 1

## 2018-04-24 MED ORDER — WITCH HAZEL-GLYCERIN EX PADS: 1.0000 "application " | MEDICATED_PAD | CUTANEOUS | Status: DC | PRN

## 2018-04-24 MED ORDER — SIMETHICONE 80 MG PO CHEW: 80.0000 mg | CHEWABLE_TABLET | ORAL | Status: DC | PRN

## 2018-04-24 MED ORDER — SENNOSIDES-DOCUSATE SODIUM 8.6-50 MG PO TABS
2.0000 | ORAL_TABLET | ORAL | Status: DC
  Administered 2018-04-25: 2 via ORAL
  Filled 2018-04-24: qty 2

## 2018-04-24 MED ORDER — PRENATAL MULTIVITAMIN CH
1.0000 | ORAL_TABLET | Freq: Every day | ORAL | Status: DC
  Administered 2018-04-24 – 2018-04-25 (×2): 1 via ORAL
  Filled 2018-04-24 (×2): qty 1

## 2018-04-24 MED ORDER — DIBUCAINE 1 % RE OINT: 1.0000 "application " | TOPICAL_OINTMENT | RECTAL | Status: DC | PRN

## 2018-04-24 MED ORDER — DIPHENHYDRAMINE HCL 25 MG PO CAPS: 25.0000 mg | ORAL_CAPSULE | Freq: Four times a day (QID) | ORAL | Status: DC | PRN

## 2018-04-24 MED ORDER — COCONUT OIL OIL: 1.0000 "application " | TOPICAL_OIL | Status: DC | PRN

## 2018-04-24 MED ORDER — BENZOCAINE-MENTHOL 20-0.5 % EX AERO
1.0000 "application " | INHALATION_SPRAY | CUTANEOUS | Status: DC | PRN
  Filled 2018-04-24: qty 56

## 2018-04-24 NOTE — Lactation Note (Signed)
This note was copied from a baby's chart. Lactation Consultation Note  Patient Name: Patricia Cole ZOXWR'UToday's Date: 04/24/2018   Baby STS on mother's chest cueing.  Offered to assist mother w/ breastfeeding and mother declined help. Mother states she would like a break. Mom has my # to call for assist w/next feeding.      Maternal Data    Feeding Feeding Type: Breast Fed Length of feed: (few sucks)  LATCH Score Latch: Repeated attempts needed to sustain latch, nipple held in mouth throughout feeding, stimulation needed to elicit sucking reflex.  Audible Swallowing: None  Type of Nipple: Flat  Comfort (Breast/Nipple): Soft / non-tender  Hold (Positioning): Assistance needed to correctly position infant at breast and maintain latch.  LATCH Score: 5  Interventions Interventions: Breast feeding basics reviewed;Assisted with latch;Hand express;Hand pump;Shells  Lactation Tools Discussed/Used Tools: Nipple Shields(20)   Consult Status      [name], Ruth Boschen 04/24/2018, 11:38 AM

## 2018-04-24 NOTE — Progress Notes (Signed)
Post Partum Day 1 Subjective: no complaints, up ad lib, voiding and + flatus. Denies headache, RUQ pain, visual changes, dyspnea, or peripheral edema. No abdominal pain. She feels like "her fever has gone." She would like to stay until PPD2. Still undecided about contraception, but feels like she has good understanding of options.  Objective: Blood pressure 115/63, pulse 93, temperature 98.4 F (36.9 C), temperature source Oral, resp. rate 18, height 5\' 2"  (1.575 m), weight 83 kg, SpO2 98 %.  Physical Exam:  General: alert, cooperative and no distress Lochia: appropriate Uterine Fundus: firm DVT Evaluation: No cords or calf tenderness. No significant calf/ankle edema.  Recent Labs    04/23/18 0814  HGB 11.1*  HCT 33.9*    Assessment/Plan: Plan for discharge tomorrow and Breastfeeding    LOS: 1 day   Margarita RanaHarrison D Neva Ramaswamy 04/24/2018, 7:48 AM

## 2018-04-24 NOTE — Progress Notes (Signed)
Post Partum Day 1 Subjective: no complaints, up ad lib, voiding and tolerating PO  Objective: Blood pressure 119/79, pulse 72, temperature 98.1 F (36.7 C), temperature source Oral, resp. rate 18, height 5\' 2"  (1.575 m), weight 83 kg, SpO2 98 %.  Physical Exam:  General: alert, cooperative and no distress Lochia: appropriate Uterine Fundus: firm DVT Evaluation: No evidence of DVT seen on physical exam. Negative Homan's sign. No cords or calf tenderness.  Recent Labs    04/23/18 0814  HGB 11.1*  HCT 33.9*    Assessment/Plan: Plan for discharge tomorrow, Breastfeeding and Lactation consult   LOS: 1 day   Levie HeritageJacob J Abbagale Goguen 04/24/2018, 11:31 AM

## 2018-04-24 NOTE — Anesthesia Postprocedure Evaluation (Signed)
Anesthesia Post Note  Patient: Patricia Cole  Procedure(s) Performed: AN AD HOC LABOR EPIDURAL     Patient location during evaluation: Mother Baby Anesthesia Type: Epidural Level of consciousness: awake and alert and oriented Pain management: satisfactory to patient Vital Signs Assessment: post-procedure vital signs reviewed and stable Respiratory status: spontaneous breathing and nonlabored ventilation Cardiovascular status: stable Postop Assessment: no headache, no backache, no signs of nausea or vomiting, adequate PO intake and patient able to bend at knees (patient up walking) Anesthetic complications: no    Last Vitals:  Vitals:   04/24/18 0259 04/24/18 0603  BP: (!) 112/54 115/63  Pulse: (!) 102 93  Resp: 19 18  Temp: 37.4 C 36.9 C  SpO2:      Last Pain:  Vitals:   04/24/18 0603  TempSrc: Oral  PainSc: Asleep   Pain Goal:                 Madison HickmanGREGORY,Daviana Haymaker

## 2018-04-25 ENCOUNTER — Encounter (HOSPITAL_COMMUNITY): Payer: Self-pay | Admitting: *Deleted

## 2018-04-25 ENCOUNTER — Ambulatory Visit: Payer: Self-pay

## 2018-04-25 MED ORDER — IBUPROFEN 600 MG PO TABS: 600.0000 mg | ORAL_TABLET | Freq: Four times a day (QID) | ORAL | 0 refills | Status: DC

## 2018-04-25 NOTE — Discharge Summary (Addendum)
OB Discharge Summary     Patient Name: Patricia Cole DOB: [birth year] MRN: [id number]  Date of admission: 04/23/2018 Delivering MD: Candis SchatzELL, PATRICIA   Date of discharge: 04/25/2018  Admitting diagnosis: 40 WEEKS POSSIBLE ROM Intrauterine pregnancy: 4763w2d     Secondary diagnosis:  Active Problems:   PROM (premature rupture of membranes)  Additional problems: Hx of HSV, chorioamnionitis.       Discharge diagnosis: Term Pregnancy Delivered                                                                                                Post partum procedures:n/a  Augmentation: Pitocin  Complications: None  Hospital course:  Onset of Labor With Vaginal Delivery     22 y.o. yo G1P0 at 6545w0d was admitted in Active Labor on 04/23/2018. Patient had an  labor course as follows:  Membrane Rupture Time/Date: 5:00 AM ,04/23/2018 . Patient developed Triple I during labor. Was given Ampicillin and Gentamicin. After delivery, mother was afebrile.  Intrapartum Procedures: Episiotomy: None [1]                                         Lacerations:  2nd degree [3]  Patient had a delivery of a Viable infant. 04/23/2018  Information for the patient's newborn:  Patricia Cole, Girl Patricia Cole [[id number]][[id number]]  Delivery Method: Vag-Spont    [name] had an uncomplicated postpartum course.  She is ambulating, tolerating a regular diet, passing flatus, and urinating well. Patient is discharged home in stable condition on 04/25/18.   Physical exam  Vitals:   04/24/18 1000 04/24/18 1410 04/24/18 2326 04/25/18 0550  BP: 119/79 120/78 110/71 126/74  Pulse: 72 92 79 67  Resp: 18 18 17 20   Temp: 98.1 F (36.7 C) 97.9 F (36.6 C) 97.7 F (36.5 C) 97.9 F (36.6 C)  TempSrc: Oral Oral Oral Oral  SpO2:  99% 96% 100%  Weight:      Height:       General: alert, cooperative and no distress Lochia: appropriate Uterine Fundus: firm Incision: N/A DVT Evaluation: No evidence of DVT seen on physical exam. Labs: Lab  Results  Component Value Date   WBC 16.2 (H) 04/23/2018   HGB 11.1 (L) 04/23/2018   HCT 33.9 (L) 04/23/2018   MCV 83.7 04/23/2018   PLT 343 04/23/2018   No flowsheet data found.  Discharge instruction: per After Visit Summary and "Baby and Me Booklet".  After visit meds:  Allergies as of 04/25/2018   No Known Allergies     Medication List    TAKE these medications   ibuprofen 600 MG tablet Commonly known as:  ADVIL,MOTRIN Take 1 tablet (600 mg total) by mouth every 6 (six) hours.   prenatal multivitamin Tabs tablet Take 1 tablet by mouth daily at 12 noon.       Diet: routine diet  Activity: Advance as tolerated. Pelvic rest for 6 weeks.   Outpatient follow up:4 weeks Follow up Appt:No future appointments. Follow up Visit:No follow-ups  on file.  Postpartum contraception: Undecided  Newborn Data: Live born female  Birth Weight: 7 lb 10.6 oz (3476 g) APGAR: 8, 9  Newborn Delivery   Birth date/time:  04/23/2018 23:28:00 Delivery type:  Vaginal, Spontaneous     Baby Feeding: Breast Disposition:home with mother   04/25/2018 Sandre Kittyaniel K Olson, MD   OB FELLOW DISCHARGE ATTESTATION  I have seen and examined this patient and agree with above documentation in the resident's note.   Marcy Sirenatherine Wallace, D.O. OB Fellow  04/25/2018, 8:12 PM

## 2018-04-25 NOTE — Discharge Instructions (Signed)
Vaginal Delivery, Care After °Refer to this sheet in the next few weeks. These instructions provide you with information about caring for yourself after vaginal delivery. Your health care provider may also give you more specific instructions. Your treatment has been planned according to current medical practices, but problems sometimes occur. Call your health care provider if you have any problems or questions. °What can I expect after the procedure? °After vaginal delivery, it is common to have: °· Some bleeding from your vagina. °· Soreness in your abdomen, your vagina, and the area of skin between your vaginal opening and your anus (perineum). °· Pelvic cramps. °· Fatigue. ° °Follow these instructions at home: °Medicines °· Take over-the-counter and prescription medicines only as told by your health care provider. °· If you were prescribed an antibiotic medicine, take it as told by your health care provider. Do not stop taking the antibiotic until it is finished. °Driving ° °· Do not drive or operate heavy machinery while taking prescription pain medicine. °· Do not drive for 24 hours if you received a sedative. °Lifestyle °· Do not drink alcohol. This is especially important if you are breastfeeding or taking medicine to relieve pain. °· Do not use tobacco products, including cigarettes, chewing tobacco, or e-cigarettes. If you need help quitting, ask your health care provider. °Eating and drinking °· Drink at least 8 eight-ounce glasses of water every day unless you are told not to by your health care provider. If you choose to breastfeed your baby, you may need to drink more water than this. °· Eat high-fiber foods every day. These foods may help prevent or relieve constipation. High-fiber foods include: °? Whole grain cereals and breads. °? Brown rice. °? Beans. °? Fresh fruits and vegetables. °Activity °· Return to your normal activities as told by your health care provider. Ask your health care provider  what activities are safe for you. °· Rest as much as possible. Try to rest or take a nap when your baby is sleeping. °· Do not lift anything that is heavier than your baby or 10 lb (4.5 kg) until your health care provider says that it is safe. °· Talk with your health care provider about when you can engage in sexual activity. This may depend on your: °? Risk of infection. °? Rate of healing. °? Comfort and desire to engage in sexual activity. °Vaginal Care °· If you have an episiotomy or a vaginal tear, check the area every day for signs of infection. Check for: °? More redness, swelling, or pain. °? More fluid or blood. °? Warmth. °? Pus or a bad smell. °· Do not use tampons or douches until your health care provider says this is safe. °· Watch for any blood clots that may pass from your vagina. These may look like clumps of dark red, brown, or black discharge. °General instructions °· Keep your perineum clean and dry as told by your health care provider. °· Wear loose, comfortable clothing. °· Wipe from front to back when you use the toilet. °· Ask your health care provider if you can shower or take a bath. If you had an episiotomy or a perineal tear during labor and delivery, your health care provider may tell you not to take baths for a certain length of time. °· Wear a bra that supports your breasts and fits you well. °· If possible, have someone help you with household activities and help care for your baby for at least a few days after   you leave the hospital. °· Keep all follow-up visits for you and your baby as told by your health care provider. This is important. °Contact a health care provider if: °· You have: °? Vaginal discharge that has a bad smell. °? Difficulty urinating. °? Pain when urinating. °? A sudden increase or decrease in the frequency of your bowel movements. °? More redness, swelling, or pain around your episiotomy or vaginal tear. °? More fluid or blood coming from your episiotomy or  vaginal tear. °? Pus or a bad smell coming from your episiotomy or vaginal tear. °? A fever. °? A rash. °? Little or no interest in activities you used to enjoy. °? Questions about caring for yourself or your baby. °· Your episiotomy or vaginal tear feels warm to the touch. °· Your episiotomy or vaginal tear is separating or does not appear to be healing. °· Your breasts are painful, hard, or turn red. °· You feel unusually sad or worried. °· You feel nauseous or you vomit. °· You pass large blood clots from your vagina. If you pass a blood clot from your vagina, save it to show to your health care provider. Do not flush blood clots down the toilet without having your health care provider look at them. °· You urinate more than usual. °· You are dizzy or light-headed. °· You have not breastfed at all and you have not had a menstrual period for 12 weeks after delivery. °· You have stopped breastfeeding and you have not had a menstrual period for 12 weeks after you stopped breastfeeding. °Get help right away if: °· You have: °? Pain that does not go away or does not get better with medicine. °? Chest pain. °? Difficulty breathing. °? Blurred vision or spots in your vision. °? Thoughts about hurting yourself or your baby. °· You develop pain in your abdomen or in one of your legs. °· You develop a severe headache. °· You faint. °· You bleed from your vagina so much that you fill two sanitary pads in one hour. °This information is not intended to replace advice given to you by your health care provider. Make sure you discuss any questions you have with your health care provider. °Document Released: 08/26/2000 Document Revised: 02/10/2016 Document Reviewed: 09/13/2015 °Elsevier Interactive Patient Education © 2018 [employer] ° °

## 2018-04-25 NOTE — Lactation Note (Addendum)
This note was copied from a baby's chart. Lactation Consultation Note  Patient Name: Patricia Cole ZOXWR'UToday's Date: 04/25/2018   Discharge RN Johnny BridgeMartha [facility] first [facility], she's been refusing the see lactation in two prior visits. [facility]; let mom know about her RN mentioning she requested lactation and if there is anything she needed. Mom declined services again stating "I'm fine". Mom had bottles of Gerber formula all over the room. Mom aware of LC services and will call if any questions or concerns arise.  Maternal Data    Feeding    Interventions    Lactation Tools Discussed/Used     Consult Status      Patricia Cole Venetia ConstableS Jermanie Minshall 04/25/2018, 10:00 PM

## 2018-04-25 NOTE — Lactation Note (Signed)
This note was copied from a baby's chart. Lactation Consultation Note  Patient Name: Patricia Cole WUJWJ'XToday's Date: 04/25/2018 Reason for consult: Follow-up assessment Baby has been getting bottles.  Mom states she would like to figure the breastfeeding out herself and does not want help.  She does have a pump at home.  Instructed to pump breasts every 3 hours giving back expressed milk to baby.  Encouraged to call if she decides she would like assist.  Maternal Data    Feeding Feeding Type: Bottle Fed - Formula Nipple Type: Slow - flow  LATCH Score                   Interventions    Lactation Tools Discussed/Used     Consult Status Consult Status: Complete    [name] S 04/25/2018, 9:12 AM

## 2018-04-26 ENCOUNTER — Telehealth: Payer: Self-pay | Admitting: *Deleted

## 2018-04-26 NOTE — Telephone Encounter (Signed)
-----   Message from Donette LarryMelanie Bhambri, [place] sent at 04/24/2018 12:59 AM EDT ----- Regarding: pp message Please schedule this patient for Postpartum visit in: 6 weeks with the following provider: Any provider For C/S patients schedule nurse incision check in weeks 2 weeks: no Low risk pregnancy complicated by: n/a Delivery mode:  SVD Anticipated Birth Control:  other/unsure PP Procedures needed: n/a  Schedule Integrated BH visit: no

## 2018-04-26 NOTE — Telephone Encounter (Signed)
Called patient but number is not working. Did send her a MyChart message.

## 2018-05-25 ENCOUNTER — Ambulatory Visit (INDEPENDENT_AMBULATORY_CARE_PROVIDER_SITE_OTHER): Payer: Medicaid Other

## 2018-05-25 DIAGNOSIS — Z23 Encounter for immunization: Secondary | ICD-10-CM | POA: Diagnosis not present

## 2018-05-25 DIAGNOSIS — Z1389 Encounter for screening for other disorder: Secondary | ICD-10-CM

## 2018-05-25 NOTE — Progress Notes (Signed)
Post Partum Exam  Patricia OverlieKaitlyn E Cole is a 22 y.o. G1P0 female who presents for a postpartum visit. She is 4 weeks postpartum following a spontaneous vaginal delivery. I have fully reviewed the prenatal and intrapartum course. The delivery was at 40 gestational weeks.  Anesthesia: epidural. Postpartum course has been unremarkabl. Baby's course has been unremarkable. Baby is feeding by Bottle/Enfamil. Bleeding no bleeding. Bowel function is normal. Bladder function is normal. Patient is not sexually active. Contraception method is none. Postpartum depression screening:neg  The following portions of the patient's history were reviewed and updated as appropriate: allergies, current medications, past family history, past medical history, past social history, past surgical history and problem list.  Review of Systems Pertinent items noted in HPI and remainder of comprehensive ROS otherwise negative.    Objective:  unknown if currently breastfeeding.  General:  alert, cooperative and no distress   Breasts:  inspection negative, no nipple discharge or bleeding, no masses or nodularity palpable  Lungs: clear to auscultation bilaterally  Heart:  regular rate and rhythm, S1, S2 normal, no murmur, click, rub or gallop  Abdomen: soft, non-tender; bowel sounds normal; no masses,  no organomegaly   Vulva:  not evaluated  Vagina: not evaluated  Cervix:  not evaluated  Corpus: not examined  Adnexa:  not evaluated  Rectal Exam: Not performed.        Assessment:    Normal postpartum exam. Pap smear not done at today's visit, patient reports recent pap but doesn't remember exact date- refuses today.  Lengthy discussion of contraception options. Patient declines all contraception due to concerns of hormonal changes and weight gain. Is considering IUD but declines today. Condoms and NFP recommended to patient.   Plan:   1. Contraception: condoms 2. Flu shot today 3. Follow up in: 1 year or as needed.    Rolm BookbinderCaroline M Cole, CNM  05/25/18 10:25 AM

## 2019-03-17 IMAGING — CR DG CHEST 1V
1 series · 1 of 1 positions shown · non-contrast
Comparison: 09/20/2009

CLINICAL DATA: Coughing congestion with intermittent fever for 4
days, pregnant

EXAM:
CHEST 1 VIEW

[chest pa]
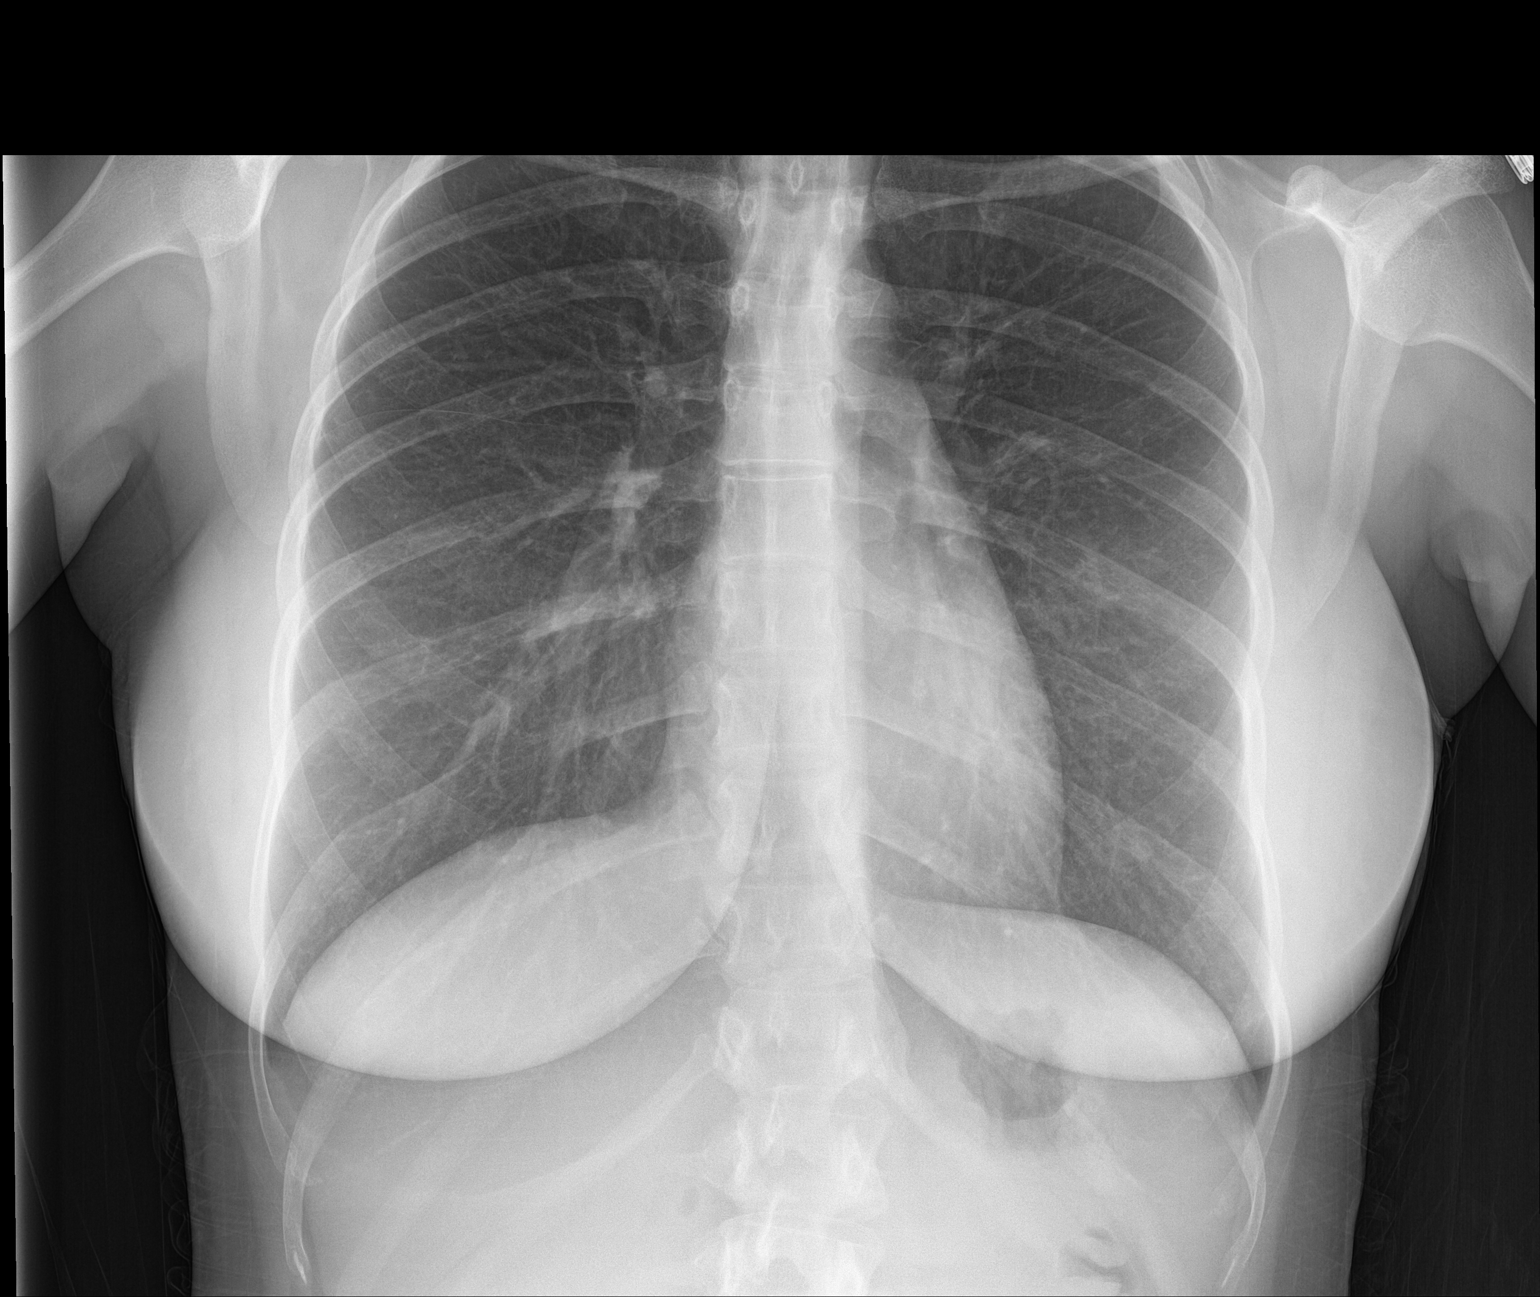

[1 of 1 positions shown; findings below may reference images not displayed]

FINDINGS: Normal heart size, mediastinal contours, and pulmonary vascularity.

Lungs clear.

No pleural effusion or pneumothorax.

Bones unremarkable.
IMPRESSION: Normal exam.

## 2020-12-10 DIAGNOSIS — K649 Unspecified hemorrhoids: Secondary | ICD-10-CM | POA: Insufficient documentation

## 2020-12-10 DIAGNOSIS — K602 Anal fissure, unspecified: Secondary | ICD-10-CM | POA: Insufficient documentation

## 2020-12-10 DIAGNOSIS — K648 Other hemorrhoids: Secondary | ICD-10-CM | POA: Insufficient documentation

## 2020-12-10 HISTORY — DX: Anal fissure, unspecified: K60.2

## 2021-06-03 ENCOUNTER — Other Ambulatory Visit: Payer: Self-pay

## 2021-06-03 ENCOUNTER — Encounter (HOSPITAL_BASED_OUTPATIENT_CLINIC_OR_DEPARTMENT_OTHER): Payer: Self-pay

## 2021-06-03 ENCOUNTER — Emergency Department (HOSPITAL_BASED_OUTPATIENT_CLINIC_OR_DEPARTMENT_OTHER): Payer: Medicaid Other

## 2021-06-03 ENCOUNTER — Emergency Department (HOSPITAL_BASED_OUTPATIENT_CLINIC_OR_DEPARTMENT_OTHER)
Admission: EM | Admit: 2021-06-03 | Discharge: 2021-06-03 | Disposition: A | Discharge status: Home / Self Care | Payer: Medicaid Other | Attending: Emergency Medicine | Admitting: Emergency Medicine

## 2021-06-03 DIAGNOSIS — R1031 Right lower quadrant pain: Secondary | ICD-10-CM | POA: Diagnosis not present

## 2021-06-03 DIAGNOSIS — R102 Pelvic and perineal pain: Secondary | ICD-10-CM

## 2021-06-03 DIAGNOSIS — R110 Nausea: Secondary | ICD-10-CM | POA: Diagnosis not present

## 2021-06-03 DIAGNOSIS — N938 Other specified abnormal uterine and vaginal bleeding: Secondary | ICD-10-CM | POA: Insufficient documentation

## 2021-06-03 DIAGNOSIS — N939 Abnormal uterine and vaginal bleeding, unspecified: Secondary | ICD-10-CM

## 2021-06-03 LAB — CBC WITH DIFFERENTIAL/PLATELET
Abs Immature Granulocytes: 0.03 10*3/uL (ref 0.00–0.07)
Basophils Absolute: 0 10*3/uL (ref 0.0–0.1)
Basophils Relative: 0 %
Eosinophils Absolute: 0.1 10*3/uL (ref 0.0–0.5)
Eosinophils Relative: 1 %
HCT: 38.6 % (ref 36.0–46.0)
Hemoglobin: 13 g/dL (ref 12.0–15.0)
Immature Granulocytes: 0 %
Lymphocytes Relative: 40 %
Lymphs Abs: 4.3 10*3/uL — ABNORMAL HIGH (ref 0.7–4.0)
MCH: 29.3 pg (ref 26.0–34.0)
MCHC: 33.7 g/dL (ref 30.0–36.0)
MCV: 86.9 fL (ref 80.0–100.0)
Monocytes Absolute: 0.7 10*3/uL (ref 0.1–1.0)
Monocytes Relative: 7 %
Neutro Abs: 5.4 10*3/uL (ref 1.7–7.7)
Neutrophils Relative %: 52 %
Platelets: 380 10*3/uL (ref 150–400)
RBC: 4.44 MIL/uL (ref 3.87–5.11)
RDW: 12.8 % (ref 11.5–15.5)
WBC: 10.5 10*3/uL (ref 4.0–10.5)
nRBC: 0 % (ref 0.0–0.2)

## 2021-06-03 LAB — COMPREHENSIVE METABOLIC PANEL
ALT: 14 U/L (ref 0–44)
AST: 16 U/L (ref 15–41)
Albumin: 4.3 g/dL (ref 3.5–5.0)
Alkaline Phosphatase: 61 U/L (ref 38–126)
Anion gap: 8 (ref 5–15)
BUN: 11 mg/dL (ref 6–20)
CO2: 26 mmol/L (ref 22–32)
Calcium: 9.5 mg/dL (ref 8.9–10.3)
Chloride: 106 mmol/L (ref 98–111)
Creatinine, Ser: 0.65 mg/dL (ref 0.44–1.00)
GFR, Estimated: >60 mL/min (ref >60)
Glucose, Bld: 100 mg/dL — ABNORMAL HIGH (ref 70–99)
Potassium: 4 mmol/L (ref 3.5–5.1)
Sodium: 140 mmol/L (ref 135–145)
Total Bilirubin: 0.1 mg/dL — ABNORMAL LOW (ref 0.3–1.2)
Total Protein: 8.1 g/dL (ref 6.5–8.1)

## 2021-06-03 LAB — URINALYSIS, ROUTINE W REFLEX MICROSCOPIC
Bilirubin Urine: NEGATIVE
Glucose, UA: NEGATIVE mg/dL
Hgb urine dipstick: NEGATIVE
Ketones, ur: NEGATIVE mg/dL
Leukocytes,Ua: NEGATIVE
Nitrite: NEGATIVE
Protein, ur: NEGATIVE mg/dL
Specific Gravity, Urine: 1.015 (ref 1.005–1.030)
pH: 7.5 (ref 5.0–8.0)

## 2021-06-03 LAB — PREGNANCY, URINE: Preg Test, Ur: NEGATIVE

## 2021-06-03 MED ORDER — DIPHENHYDRAMINE HCL 25 MG PO CAPS
25.0000 mg | ORAL_CAPSULE | Freq: Once | ORAL | Status: AC
  Administered 2021-06-03: 25 mg via ORAL
  Filled 2021-06-03: qty 1

## 2021-06-03 MED ORDER — IOHEXOL 350 MG/ML SOLN
85.0000 mL | Freq: Once | INTRAVENOUS | Status: AC | PRN
  Administered 2021-06-03: 85 mL via INTRAVENOUS

## 2021-06-03 MED ORDER — ONDANSETRON HCL 4 MG/2ML IJ SOLN
4.0000 mg | Freq: Once | INTRAMUSCULAR | Status: AC
  Administered 2021-06-03: 4 mg via INTRAVENOUS
  Filled 2021-06-03: qty 2

## 2021-06-03 MED ORDER — MORPHINE SULFATE (PF) 4 MG/ML IV SOLN
4.0000 mg | Freq: Once | INTRAVENOUS | Status: AC
  Administered 2021-06-03: 4 mg via INTRAVENOUS
  Filled 2021-06-03: qty 1

## 2021-06-03 NOTE — Discharge Instructions (Signed)
Take Tylenol Motrin as needed for pain.  Follow-up with your primary care doctor in the next week or so to inform them of the visit and discuss the fertility treatment and make sure that is not contributing to the irregular vaginal bleeding and pain.  Your work-up today was very reassuring, no signs of emergent pathology on the CT or ultrasound.  Think change or worsening return back to the ED as needed for additional evaluation.

## 2021-06-03 NOTE — ED Provider Notes (Signed)
[facility] EMERGENCY DEPARTMENT Provider Note   CSN: [id number] Arrival date & time: 06/03/21  1504     History Chief Complaint  Patient presents with   Vaginal Bleeding    Patricia Cole is a 25 y.o. female.   Vaginal Bleeding Associated symptoms: abdominal pain and nausea   Associated symptoms: no dysuria    Patient reports with right pelvic pain.  Started acutely this morning, woke her up from her sleep.  Patient reports she has been 4 days late for her period, it started 2 days ago.  She has been having more bleeding than normal, having to change tampon every 2 hours.  The pain did not start until this morning, it so painful it has associated nausea but no vomiting.  Denies any vaginal discharge, no prior abdominal surgeries.  History of ovarian cyst, no history of ovarian cyst rupture.  She did take 2 at home pregnancy test.  One was positive the other was negative.  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   Internal derangement of right knee 05/29/2014   Patellofemoral pain syndrome 05/29/2014   Right knee pain 05/29/2014    Past Surgical History:  Procedure Laterality Date   WISDOM TOOTH EXTRACTION       OB History     Gravida  1   Para      Term      Preterm      AB      Living         SAB      IAB      Ectopic      Multiple      Live Births              Family History  Problem Relation Age of Onset   Breast cancer Maternal Grandmother    Breast cancer Paternal Grandmother     Social History   Tobacco Use   Smoking status: Never   Smokeless tobacco: Never  Vaping Use   Vaping Use: Never used   Substance Use Topics   Alcohol use: Yes    Comment: occ   Drug use: No    Home Medications Prior to Admission medications   Medication Sig Start Date End Date Taking? Authorizing Provider  ibuprofen (ADVIL,MOTRIN) 600 MG tablet Take 1 tablet (600 mg total) by mouth every 6 (six) hours. 04/25/18   Sandre Kitty, MD  Prenatal Vit-Fe Fumarate-FA (PRENATAL MULTIVITAMIN) TABS tablet Take 1 tablet by mouth daily at 12 noon.    [provider]    Allergies    Phenergan [promethazine] and Tape  Review of Systems   Review of Systems  Respiratory:  Negative for shortness of breath.   Cardiovascular:  Negative for chest pain.  Gastrointestinal:  Positive for abdominal pain and nausea. Negative for constipation and vomiting.  Genitourinary:  Positive for pelvic pain and vaginal bleeding. Negative for dysuria and hematuria.   Physical Exam Updated Vital Signs BP (!) 126/92 (BP Location: Left Arm)   Pulse 95   Temp 98.4 F (36.9 C) (Oral)   Resp 20   Ht 5\' 2"  (1.575 m)   Wt 70.3  kg   LMP 04/27/2021   SpO2 99%   BMI 28.35 kg/m   Physical Exam Vitals and nursing note reviewed. Exam conducted with a chaperone present.  Constitutional:      Appearance: Normal appearance.  HENT:     Head: Normocephalic and atraumatic.  Eyes:     General: No scleral icterus.       Right eye: No discharge.        Left eye: No discharge.     Extraocular Movements: Extraocular movements intact.     Pupils: Pupils are equal, round, and reactive to light.  Cardiovascular:     Rate and Rhythm: Normal rate and regular rhythm.     Pulses: Normal pulses.     Heart sounds: Normal heart sounds. No murmur heard.   No friction rub. No gallop.  Pulmonary:     Effort: Pulmonary effort is normal. No respiratory distress.     Breath sounds: Normal breath sounds.  Abdominal:     General: Abdomen is flat. Bowel sounds are normal. There is no distension.     Palpations: Abdomen is soft.      Tenderness: There is abdominal tenderness.     Comments: Right lower quadrant tenderness, no guarding or rigidity.  Abdomen is soft  Skin:    General: Skin is warm and dry.     Coloration: Skin is not jaundiced.  Neurological:     Mental Status: She is alert. Mental status is at baseline.     Coordination: Coordination normal.    ED Results / Procedures / Treatments   Labs (all labs ordered are listed, but only abnormal results are displayed) Labs Reviewed  PREGNANCY, URINE    EKG None  Radiology No results found.  Procedures Procedures   Medications Ordered in ED Medications - No data to display  ED Course  I have reviewed the triage vital signs and the nursing notes.  Pertinent labs & imaging results that were available during my care of the patient were reviewed by me and considered in my medical decision making (see chart for details).    MDM Rules/Calculators/A&P                           Patient vitals are stable, her pulse rate is mildly elevated at 95 but she is not tachycardic or febrile.  Will check CBC to assess for amount of blood loss.  We will also get an ultrasound to assess for possible ruptured ovarian cyst.  Right lower quadrant pain could also be due to appendicitis, but due to the associated vaginal bleeding we will start with an ultrasound.  No hypotension or tachycardia.  Patient is still stable.  Negative urine pregnancy, not an ectopic.  No leukocytosis concerning for infectious etiology.  Lower suspicion for acute intra-abdominal process.  Also not anemic, not a significant blood loss.  Urine is clear, no signs of UTI.  Ultrasound negative for adnexal tenderness.  Patient is still very tender, will proceed with CT abdomen to rule out appendicitis.  CT abdomen negative.  On reexamination patient is nontender, abdomen is soft.  She is resting comfortable, pain is resolved.  Patient is informed me that she is taking fertility treatment because she is  in the process of donating her eggs.  I suspect that her pain was due to the onset of her menstrual cycle, doubt any acute or emergent pathology at this time.  Advised to follow-up with her  primary care doctor regarding the hormone treatments and to help follow-up with her care.  At this time she is stable for discharge.  Final Clinical Impression(s) / ED Diagnoses Final diagnoses:  None    Rx / DC Orders ED Discharge Orders     None        Theron Arista, Cordelia Poche 06/03/21 2109    Milagros Loll, MD 06/07/21 1016

## 2021-06-03 NOTE — ED Triage Notes (Addendum)
Pt c/o heavy vaginal bleeding and pelvic pain started ~3am-LNMP 8/16-states she had a neg and pos preg test PTA-NAD-steady gait

## 2021-06-03 NOTE — ED Notes (Signed)
Patient given discharge instructions, all questions answered. Patient in possession of all belongings, directed to the discharge area  

## 2021-08-03 ENCOUNTER — Encounter: Payer: Self-pay | Admitting: Obstetrics and Gynecology

## 2021-08-03 ENCOUNTER — Other Ambulatory Visit: Payer: Self-pay

## 2021-08-03 ENCOUNTER — Ambulatory Visit (INDEPENDENT_AMBULATORY_CARE_PROVIDER_SITE_OTHER): Payer: No Typology Code available for payment source | Admitting: Obstetrics and Gynecology

## 2021-08-03 VITALS — BP 115/76 | HR 87 | Ht 62.0 in | Wt 151.0 lb

## 2021-08-03 DIAGNOSIS — Z01419 Encounter for gynecological examination (general) (routine) without abnormal findings: Secondary | ICD-10-CM | POA: Insufficient documentation

## 2021-08-03 NOTE — Progress Notes (Signed)
GYNECOLOGY ANNUAL PREVENTATIVE CARE ENCOUNTER NOTE  History:     Patricia Cole is a 25 y.o. G1P0 female here for a routine annual gynecologic exam.  Current complaints: None, recent egg donor x 2.    Denies abnormal vaginal bleeding, discharge, pelvic pain, problems with intercourse or other gynecologic concerns.    Gynecologic History Patient's last menstrual period was 07/26/2021. Contraception: none- interested in BTL Last Pap:2021. Result was normal with negative HPV  Obstetric History OB History  Gravida Para Term Preterm AB Living  1            SAB IAB Ectopic Multiple Live Births               # Outcome Date GA Lbr Len/2nd Weight Sex Delivery Anes PTL Lv  1 Gravida             Past Medical History:  Diagnosis Date   Medical history non-contributory    Seasonal allergies     Past Surgical History:  Procedure Laterality Date   WISDOM TOOTH EXTRACTION      Current Outpatient Medications on File Prior to Visit  Medication Sig Dispense Refill   ibuprofen (ADVIL,MOTRIN) 600 MG tablet Take 1 tablet (600 mg total) by mouth every 6 (six) hours. (Patient not taking: Reported on 08/03/2021) 30 tablet 0   Prenatal Vit-Fe Fumarate-FA (PRENATAL MULTIVITAMIN) TABS tablet Take 1 tablet by mouth daily at 12 noon. (Patient not taking: Reported on 08/03/2021)     No current facility-administered medications on file prior to visit.    Allergies  Allergen Reactions   Morphine Hives, Itching and Shortness Of Breath   Other Rash   Tape Rash   Wound Dressing Adhesive Rash   Metformin Other (See Comments)   Phenergan [Promethazine] Hives    Social History:  reports that she has never smoked. She has never used smokeless tobacco. She reports current alcohol use. She reports that she does not use drugs.  Family History  Problem Relation Age of Onset   Breast cancer Maternal Grandmother    Breast cancer Paternal Grandmother     The following portions of the patient's  history were reviewed and updated as appropriate: allergies, current medications, past family history, past medical history, past social history, past surgical history and problem list.  Review of Systems Pertinent items noted in HPI and remainder of comprehensive ROS otherwise negative.  Physical Exam:  BP 115/76   Pulse 87   Ht 5\' 2"  (1.575 m)   Wt 151 lb (68.5 kg)   LMP 07/26/2021   BMI 27.62 kg/m  CONSTITUTIONAL: Well-developed, well-nourished female in no acute distress.  HENT:  Normocephalic, atraumatic, External right and left ear normal.  EYES: Conjunctivae and EOM are normal. Pupils are equal, round, and reactive to light. No scleral icterus.  NECK: Normal range of motion, supple, no masses.  Normal thyroid.  SKIN: Skin is warm and dry. No rash noted. Not diaphoretic. No erythema. No pallor. MUSCULOSKELETAL: Normal range of motion. No tenderness.  No cyanosis, clubbing, or edema. NEUROLOGIC: Alert and oriented to person, place, and time. Normal reflexes, muscle tone coordination.  PSYCHIATRIC: Normal mood and affect. Normal behavior. Normal judgment and thought content. CARDIOVASCULAR: Normal heart rate noted, regular rhythm RESPIRATORY: Clear to auscultation bilaterally. Effort and breath sounds normal, no problems with respiration noted. BREASTS: Symmetric in size. No masses, tenderness, skin changes, nipple drainage, or lymphadenopathy bilaterally. Performed in the presence of a chaperone. ABDOMEN: Soft, no distention noted.  No tenderness, rebound or guarding.  PELVIC: Normal appearing external genitalia and urethral meatus.  Normal uterine size, no other palpable masses, no uterine or adnexal tenderness.  Performed in the presence of a chaperone.   Assessment and Plan:   1. Women's annual routine gynecological examination  Doing well Interested in BTL- will schedule with MD Pap not do, last pap in care everywhere.   Routine preventative health maintenance measures  emphasized. Please refer to After Visit Summary for other counseling recommendations.     Rielly Brunn, Harolyn Rutherford, NP [facility] [employer], [facility]

## 2021-08-03 NOTE — Progress Notes (Signed)
Last pap- 11/28/19- negative

## 2021-09-27 ENCOUNTER — Other Ambulatory Visit: Payer: Self-pay

## 2021-09-27 ENCOUNTER — Encounter: Payer: Self-pay | Admitting: Family Medicine

## 2021-09-27 ENCOUNTER — Ambulatory Visit (INDEPENDENT_AMBULATORY_CARE_PROVIDER_SITE_OTHER): Payer: No Typology Code available for payment source | Admitting: Family Medicine

## 2021-09-27 DIAGNOSIS — Z130 Encounter for screening for diseases of the blood and blood-forming organs and certain disorders involving the immune mechanism: Secondary | ICD-10-CM | POA: Insufficient documentation

## 2021-09-27 DIAGNOSIS — Z3009 Encounter for other general counseling and advice on contraception: Secondary | ICD-10-CM | POA: Diagnosis not present

## 2021-09-27 NOTE — Progress Notes (Signed)
° °  Subjective:    Patient ID: Patricia Cole is a 26 y.o. female presenting with Discuss BTL  on 09/27/2021  HPI: G1P1001 who has decided she has completed child bearing.  Her husband has a terminal illness. She does not want more kids. She has been intolerant of multiple forms of birth control due to weight gain and side effects. She is confidant in her decision.   Review of Systems  Constitutional:  Negative for chills and fever.  Respiratory:  Negative for shortness of breath.   Cardiovascular:  Negative for chest pain.  Gastrointestinal:  Negative for abdominal pain, nausea and vomiting.  Genitourinary:  Negative for dysuria.  Skin:  Negative for rash.     Objective:    Ht 5\' 2"  (1.575 m)    Wt 154 lb (69.9 kg)    BMI 28.17 kg/m  Physical Exam Constitutional:      General: She is not in acute distress.    Appearance: She is well-developed.  HENT:     Head: Normocephalic and atraumatic.  Eyes:     General: No scleral icterus. Cardiovascular:     Rate and Rhythm: Normal rate.  Pulmonary:     Effort: Pulmonary effort is normal.  Abdominal:     Palpations: Abdomen is soft.  Musculoskeletal:     Cervical back: Neck supple.  Skin:    General: Skin is warm and dry.  Neurological:     Mental Status: She is alert and oriented to person, place, and time.        Assessment & Plan:   Problem List Items Addressed This Visit       Unprioritized   Sterilization consult    Patient counseled, r.e. Risks benefits of BTL, including permanency of procedure,and benefits of salpingectomy.  Patient verbalized understanding and desires to proceed Risks include but are not limited to bleeding, infection, injury to surrounding structures, including bowel, bladder and ureters, blood clots, and death.  Likelihood of success is high.         Return in about 3 months (around 12/26/2021) for postop check.  12/28/2021, MD 09/27/2021 9:58 AM

## 2021-09-27 NOTE — Assessment & Plan Note (Signed)
Patient counseled, r.e. Risks benefits of BTL, including permanency of procedure,and benefits of salpingectomy.  Patient verbalized understanding and desires to proceed Risks include but are not limited to bleeding, infection, injury to surrounding structures, including bowel, bladder and ureters, blood clots, and death.  Likelihood of success is high.

## 2021-09-29 ENCOUNTER — Telehealth: Payer: Self-pay | Admitting: *Deleted

## 2021-09-29 ENCOUNTER — Telehealth: Payer: Self-pay

## 2021-09-29 NOTE — Telephone Encounter (Signed)
Called patient to discuss potential surgery dates and times, no answer, left voicemail, will scheduled her on first available. Left her the call back number to reschedule if that date does not work for her.

## 2021-09-29 NOTE — Telephone Encounter (Signed)
LM on voicemail to call the office ASAP.  She is scheduled for a BTS and needs a medicaid tubal consent signed before 10/05/21 or her surgery will be cancelled.

## 2021-10-06 ENCOUNTER — Encounter: Payer: Self-pay | Admitting: *Deleted

## 2021-11-05 ENCOUNTER — Ambulatory Visit (HOSPITAL_BASED_OUTPATIENT_CLINIC_OR_DEPARTMENT_OTHER): Admit: 2021-11-05 | Payer: No Typology Code available for payment source | Admitting: Family Medicine

## 2021-11-05 ENCOUNTER — Encounter (HOSPITAL_BASED_OUTPATIENT_CLINIC_OR_DEPARTMENT_OTHER): Payer: Self-pay

## 2021-11-05 SURGERY — SALPINGECTOMY, BILATERAL, LAPAROSCOPIC
Anesthesia: Choice | Laterality: Bilateral

## 2021-11-12 ENCOUNTER — Ambulatory Visit (INDEPENDENT_AMBULATORY_CARE_PROVIDER_SITE_OTHER): Payer: No Typology Code available for payment source | Admitting: Nurse Practitioner

## 2021-11-12 ENCOUNTER — Other Ambulatory Visit: Payer: Self-pay

## 2021-11-12 ENCOUNTER — Encounter: Payer: Self-pay | Admitting: Nurse Practitioner

## 2021-11-12 VITALS — BP 116/88 | HR 83 | Temp 98.0°F | Ht 62.0 in | Wt 159.4 lb

## 2021-11-12 DIAGNOSIS — E663 Overweight: Secondary | ICD-10-CM

## 2021-11-12 DIAGNOSIS — Z0000 Encounter for general adult medical examination without abnormal findings: Secondary | ICD-10-CM

## 2021-11-12 DIAGNOSIS — K649 Unspecified hemorrhoids: Secondary | ICD-10-CM | POA: Diagnosis not present

## 2021-11-12 NOTE — Assessment & Plan Note (Signed)
BMI 29. Discussed diet and exercise. It sounds like she has an overall balanced diet and she goes to the gym every morning before work. Prior labs including CMP, CBC, TSH, vitamin B12, FSH, LH, and testosterone were reviewed and all normal. She has tried vitamin B12 injections, contrave, and metformin in the past to help with weight loss with no success. Will place a referral to nutritionist for further evaluation and recommendations.  ?

## 2021-11-12 NOTE — Progress Notes (Signed)
? ?New Patient Office Visit ? ?Subjective:  ?Patient ID: Patricia Cole, female    DOB: [birth year]  Age: 26 y.o. MRN: [id number] ? ?CC:  ?Chief Complaint  ?Patient presents with  ? Establish Care  ?  Np. Est care. Pt c/o external hemrriods x several years  ? ? ?HPI ?Patricia Cole presents for new patient visit to establish care.  Introduced to Designer, jewellery role and practice setting.  All questions answered.  Discussed provider/patient relationship and expectations. ? ?She has been having trouble with hemorrhoids since the birth of her daughter 4 years ago. For the last 2 years she has had an external hemorrhoid that has been noticeable. She states that sometimes it flares and bleeds. She tried taking fiber pills which didn't make a difference with the hemorrhoid but helps keep her from having constipation. She saw a Psychologist, sport and exercise, Dr. Phineas Douglas, who told her they would have to cut a muscle to remove the hemorrhoid, but she would like a second opinion. She uses OTC suppositories when the hemorrhoid flares up.  ? ?She has struggled with weight since having her daughter. She tried contrave, B12 injections, metformin, diet and exercise. She is close to her highest weight now. She notices that she has swelling in her hands and ankles when her weight is up. She goes to the gym every day before work.  ? ?Depression and Anxiety Screen done and shows: ? ?Depression screen [facility] 2/9 11/12/2021  ?Decreased Interest 0  ?Down, Depressed, Hopeless 0  ?PHQ - 2 Score 0  ?Altered sleeping 0  ?Tired, decreased energy 0  ?Change in appetite 0  ?Feeling bad or failure about yourself  0  ?Trouble concentrating 0  ?Moving slowly or fidgety/restless 0  ?Suicidal thoughts 0  ?PHQ-9 Score 0  ?Difficult doing work/chores Not difficult at all  ? ?GAD 7 : Generalized Anxiety Score 11/12/2021  ?Nervous, Anxious, on Edge 0  ?Control/stop worrying 0  ?Worry too much - different things 0  ?Trouble relaxing 0  ?Restless 0  ?Easily annoyed or irritable 0   ?Afraid - awful might happen 0  ?Total GAD 7 Score 0  ? ? ?Past Medical History:  ?Diagnosis Date  ? Anal fissure 12/10/2020  ? Hemorrhoid   ? Seasonal allergies   ? ? ?Past Surgical History:  ?Procedure Laterality Date  ? WISDOM TOOTH EXTRACTION    ? ? ?Family History  ?Problem Relation Age of Onset  ? Breast cancer Maternal Grandmother   ? Breast cancer Paternal Grandmother   ? ? ?Social History  ? ?Socioeconomic History  ? Marital status: Married  ?  Spouse name: Not on file  ? Number of children: Not on file  ? Years of education: Not on file  ? Highest education level: Not on file  ?Occupational History  ? Not on file  ?Tobacco Use  ? Smoking status: Never  ? Smokeless tobacco: Never  ?Vaping Use  ? Vaping Use: Never used  ?Substance and Sexual Activity  ? Alcohol use: Yes  ?  Comment: occ  ? Drug use: No  ? Sexual activity: Yes  ?  Birth control/protection: None  ?Other Topics Concern  ? Not on file  ?Social History Narrative  ? Not on file  ? ?Social Determinants of Health  ? ?Financial Resource Strain: Not on file  ?Food Insecurity: Not on file  ?Transportation Needs: Not on file  ?Physical Activity: Not on file  ?Stress: Not on file  ?Social Connections: Not on file  ?  Intimate Partner Violence: Not on file  ? ? ?ROS ?Review of Systems  ?Constitutional:  Positive for fatigue.  ?HENT: Negative.    ?Eyes: Negative.   ?Respiratory: Negative.    ?Cardiovascular: Negative.   ?Gastrointestinal:  Negative for abdominal pain, constipation and diarrhea.  ?     Hemorrhoid ?  ?Endocrine: Negative.   ?Genitourinary: Negative.   ?Musculoskeletal: Negative.   ?Skin: Negative.   ?Neurological: Negative.   ?Psychiatric/Behavioral: Negative.    ? ?Objective:  ? ?Today's Vitals: BP 116/88   Pulse 83   Temp 98 ?F (36.7 ?C) (Temporal)   Ht 5\' 2"  (1.575 m)   Wt 159 lb 6.4 oz (72.3 kg)   LMP 11/09/2021 (Exact Date)   SpO2 93%   BMI 29.15 kg/m?  ? ?Physical Exam ?Vitals and nursing note reviewed.  ?Constitutional:   ?    General: She is not in acute distress. ?   Appearance: Normal appearance.  ?HENT:  ?   Head: Normocephalic and atraumatic.  ?   Right Ear: Tympanic membrane, ear canal and external ear normal.  ?   Left Ear: Tympanic membrane, ear canal and external ear normal.  ?   Nose: Nose normal.  ?   Mouth/Throat:  ?   Mouth: Mucous membranes are moist.  ?   Pharynx: Oropharynx is clear.  ?Eyes:  ?   Conjunctiva/sclera: Conjunctivae normal.  ?Cardiovascular:  ?   Rate and Rhythm: Normal rate and regular rhythm.  ?   Pulses: Normal pulses.  ?   Heart sounds: Normal heart sounds.  ?Pulmonary:  ?   Effort: Pulmonary effort is normal.  ?   Breath sounds: Normal breath sounds.  ?Abdominal:  ?   General: Bowel sounds are normal.  ?   Palpations: Abdomen is soft.  ?   Tenderness: There is no abdominal tenderness.  ?Musculoskeletal:     ?   General: Normal range of motion.  ?   Cervical back: Normal range of motion.  ?   Right lower leg: No edema.  ?   Left lower leg: No edema.  ?Skin: ?   General: Skin is warm and dry.  ?Neurological:  ?   General: No focal deficit present.  ?   Mental Status: She is alert and oriented to person, place, and time.  ?   Cranial Nerves: No cranial nerve deficit.  ?   Coordination: Coordination normal.  ?   Gait: Gait normal.  ?Psychiatric:     ?   Mood and Affect: Mood normal.     ?   Behavior: Behavior normal.     ?   Thought Content: Thought content normal.     ?   Judgment: Judgment normal.  ? ? ?Assessment & Plan:  ? ?Problem List Items Addressed This Visit   ? ?  ? Cardiovascular and Mediastinum  ? Hemorrhoids  ?  Chronic, ongoing. She uses OTC suppositories when the hemorrhoid flares up. She saw Dr. Phineas Douglas with [facility] for possible hemorrhoid removal, but she would like a second opinion. Referral placed to general surgery.  ?  ?  ? Relevant Orders  ? Ambulatory referral to General Surgery  ?  ? Other  ? Overweight  ?  BMI 29. Discussed diet and exercise. It sounds like she has an overall  balanced diet and she goes to the gym every morning before work. Prior labs including CMP, CBC, TSH, vitamin B12, FSH, LH, and testosterone were reviewed and all normal. She  has tried vitamin B12 injections, contrave, and metformin in the past to help with weight loss with no success. Will place a referral to nutritionist for further evaluation and recommendations.  ?  ?  ? Relevant Orders  ? Amb ref to Medical Nutrition Therapy-MNT  ? ?Other Visit Diagnoses   ? ? Routine general medical examination at a health care facility    -  Primary  ? Health maintenance reviewed and updated. CMP, CBC, TSH, vitamin B12 reviewed from 05/2021.   ? ?  ? ? ?Outpatient Encounter Medications as of 11/12/2021  ?Medication Sig  ? ibuprofen (ADVIL,MOTRIN) 600 MG tablet Take 1 tablet (600 mg total) by mouth every 6 (six) hours. (Patient not taking: Reported on 08/03/2021)  ? [DISCONTINUED] Prenatal Vit-Fe Fumarate-FA (PRENATAL MULTIVITAMIN) TABS tablet Take 1 tablet by mouth daily at 12 noon. (Patient not taking: Reported on 08/03/2021)  ? ?No facility-administered encounter medications on file as of 11/12/2021.  ? ? ?Follow-up: Return in about 1 year (around 11/13/2022) for CPE.  ? ?Charyl Dancer, NP ? ?

## 2021-11-12 NOTE — Patient Instructions (Signed)
It was great to see you! ? ?I have placed a referral to a surgeon and a nutritionist for you. You should hear from them soon to schedule an appointment.  ? ?Let's follow-up in 1 year, sooner if you have concerns. ? ?If a referral was placed today, you will be contacted for an appointment. Please note that routine referrals can sometimes take up to 3-4 weeks to process. Please call our office if you haven't heard anything after this time frame. ? ?Take care, ? ?Rodman Pickle, NP ? ?

## 2021-11-12 NOTE — Assessment & Plan Note (Signed)
Chronic, ongoing. She uses OTC suppositories when the hemorrhoid flares up. She saw Dr. Rubye Oaks with [facility] for possible hemorrhoid removal, but she would like a second opinion. Referral placed to general surgery.  ?

## 2022-01-24 NOTE — Progress Notes (Deleted)
   Acute Office Visit  Subjective:     Patient ID: Patricia Cole, female    DOB: [birth year], 26 y.o.   MRN: [id number]  No chief complaint on file.   HPI Patient is in today for anxiety and depression.   ROS      Objective:    There were no vitals taken for this visit. {Vitals History (Optional):23777}  Physical Exam  No results found for any visits on 01/26/22.      Assessment & Plan:   Problem List Items Addressed This Visit   None   No orders of the defined types were placed in this encounter.   No follow-ups on file.  Gerre Scull, NP

## 2022-01-26 ENCOUNTER — Telehealth: Payer: Self-pay | Admitting: Nurse Practitioner

## 2022-01-26 ENCOUNTER — Ambulatory Visit: Payer: No Typology Code available for payment source | Admitting: Nurse Practitioner

## 2022-01-26 NOTE — Telephone Encounter (Signed)
Patient/Caregiver was notified of No Show/Late Cancellation Policy & possible $50 charge. ?Visit was cancelled with reason "No Show/Cancel within 24 hours" for tracking & charging. ? ?Caller Name: Migdalia Olejniczak ?Caller Ph #: [phone] ?Date of APPT: 01/26/22 ?Reason given for no show/late cancellation: no reason ?No Show Letter printed & put in outgoing mail (Yes/No): yes ? ?~~~Route message to admin supervisor and clinical team/CMA~~~ ? ?  ?

## 2022-02-16 NOTE — Telephone Encounter (Signed)
1st no show, fee waived ?

## 2022-02-22 ENCOUNTER — Emergency Department (INDEPENDENT_AMBULATORY_CARE_PROVIDER_SITE_OTHER)
Admission: RE | Admit: 2022-02-22 | Discharge: 2022-02-22 | Disposition: A | Discharge status: Home / Self Care | Payer: No Typology Code available for payment source | Source: Ambulatory Visit | Attending: Family Medicine | Admitting: Family Medicine

## 2022-02-22 VITALS — BP 114/75 | HR 98 | Temp 98.8°F | Resp 20 | Ht 62.0 in | Wt 155.0 lb

## 2022-02-22 DIAGNOSIS — H6123 Impacted cerumen, bilateral: Secondary | ICD-10-CM

## 2022-02-22 DIAGNOSIS — J069 Acute upper respiratory infection, unspecified: Secondary | ICD-10-CM | POA: Diagnosis not present

## 2022-02-22 LAB — POCT INFLUENZA A/B
Influenza A, POC: NEGATIVE
Influenza B, POC: NEGATIVE

## 2022-02-22 LAB — POCT RAPID STREP A (OFFICE): Rapid Strep A Screen: NEGATIVE

## 2022-02-22 LAB — POC SARS CORONAVIRUS 2 AG -  ED: SARS Coronavirus 2 Ag: NEGATIVE

## 2022-02-22 MED ORDER — AZITHROMYCIN 250 MG PO TABS: ORAL_TABLET | ORAL | 0 refills | Status: DC

## 2022-02-22 NOTE — ED Triage Notes (Signed)
Pt states that she has a sore throat, bilateral ear pain and nasal congestion. X4 days   Pt states that she is vaccinated for covid. Pt states that she has had flu vaccine.

## 2022-02-22 NOTE — Discharge Instructions (Signed)
Take plain guaifenesin (1200mg  extended release tabs such as Mucinex) twice daily, with plenty of water, for cough and congestion.  May add Pseudoephedrine (30mg , one or two every 4 to 6 hours) for sinus congestion.  Get adequate rest.   May use Afrin nasal spray (or generic oxymetazoline) each morning for about 5 days and then discontinue.  Also recommend using saline nasal spray several times daily and saline nasal irrigation (AYR is a common brand).  Use Flonase nasal spray each morning after using Afrin nasal spray and saline nasal irrigation. Try warm salt water gargles for sore throat.  Stop all antihistamines (Claritin) for now, and other non-prescription cough/cold preparations. May take Delsym Cough Suppressant ("12 Hour Cough Relief") at bedtime for nighttime cough.  Begin Azithromycin if not improving about 5 days or if persistent fever develops   Recommend using non-prescription earwax softening drops in left ear for several days.

## 2022-02-22 NOTE — ED Provider Notes (Signed)
[facility]    CSN: [id number] Arrival date & time: 02/22/22  1159      History   Chief Complaint Chief Complaint  Patient presents with   Sore Throat    Entered by patient    HPI Patricia Cole is a 26 y.o. female.   HPI  Past Medical History:  Diagnosis Date   Anal fissure 12/10/2020   Hemorrhoid    Seasonal allergies     Patient Active Problem List   Diagnosis Date Noted   Overweight 11/12/2021   Sterilization consult 09/27/2021   Hemorrhoids 12/10/2020   HSV-1 infection 12/16/2015    Past Surgical History:  Procedure Laterality Date   WISDOM TOOTH EXTRACTION      OB History     Gravida  1   Para  1   Term  1   Preterm      AB      Living  1      SAB      IAB      Ectopic      Multiple      Live Births  1            Home Medications    Prior to Admission medications   Medication Sig Start Date End Date Taking? Authorizing Provider  azithromycin (ZITHROMAX Z-PAK) 250 MG tablet Take 2 tabs today; then begin one tab once daily for 4 more days. 02/22/22  Yes Lattie Haw, MD  ibuprofen (ADVIL,MOTRIN) 600 MG tablet Take 1 tablet (600 mg total) by mouth every 6 (six) hours. Patient not taking: Reported on 08/03/2021 04/25/18   Sandre Kitty, MD    Family History Family History  Problem Relation Age of Onset   Breast cancer Maternal Grandmother    Breast cancer Paternal Grandmother     Social History Social History   Tobacco Use   Smoking status: Never   Smokeless tobacco: Never  Vaping Use   Vaping Use: Never used  Substance Use Topics   Alcohol use: Not Currently    Comment: occ   Drug use: No     Allergies   Morphine, Other, Tape, Wound dressing adhesive, Metformin, and Phenergan [promethazine]   Review of Systems Review of Systems   Physical Exam Triage Vital Signs ED Triage Vitals  Enc Vitals Group     BP 02/22/22 1243 114/75     Pulse Rate 02/22/22 1243 98     Resp 02/22/22 1243  20     Temp 02/22/22 1243 98.8 F (37.1 C)     Temp Source 02/22/22 1243 Oral     SpO2 02/22/22 1243 99 %     Weight 02/22/22 1241 155 lb (70.3 kg)     Height 02/22/22 1241 5\' 2"  (1.575 m)     Head Circumference --      Peak Flow --      Pain Score 02/22/22 1240 5     Pain Loc --      Pain Edu? --      Excl. in GC? --    No data found.  Updated Vital Signs BP 114/75 (BP Location: Left Arm)   Pulse 98   Temp 98.8 F (37.1 C) (Oral)   Resp 20   Ht 5\' 2"  (1.575 m)   Wt 70.3 kg   LMP 02/11/2022   SpO2 99%   BMI 28.35 kg/m   Visual Acuity Right Eye Distance:   Left Eye Distance:   Bilateral  Distance:    Right Eye Near:   Left Eye Near:    Bilateral Near:     Physical Exam   UC Treatments / Results  Labs (all labs ordered are listed, but only abnormal results are displayed) Labs Reviewed  POCT RAPID STREP A (OFFICE) - Normal  POCT INFLUENZA A/B  POC SARS CORONAVIRUS 2 AG -  ED    EKG   Radiology No results found.  Procedures Procedures (including critical care time)  Medications Ordered in UC Medications - No data to display  Initial Impression / Assessment and Plan / UC Course  I have reviewed the triage vital signs and the nursing notes.  Pertinent labs & imaging results that were available during my care of the patient were reviewed by me and considered in my medical decision making (see chart for details).    Benign exam.  There is no evidence of bacterial infection today.  Treat symptomatically for now. Followup with Family Doctor if not improved in about 10 days.  Final Clinical Impressions(s) / UC Diagnoses   Final diagnoses:  Viral URI with cough  Bilateral impacted cerumen     Discharge Instructions      Take plain guaifenesin (1200mg  extended release tabs such as Mucinex) twice daily, with plenty of water, for cough and congestion.  May add Pseudoephedrine (30mg , one or two every 4 to 6 hours) for sinus congestion.  Get adequate  rest.   May use Afrin nasal spray (or generic oxymetazoline) each morning for about 5 days and then discontinue.  Also recommend using saline nasal spray several times daily and saline nasal irrigation (AYR is a common brand).  Use Flonase nasal spray each morning after using Afrin nasal spray and saline nasal irrigation. Try warm salt water gargles for sore throat.  Stop all antihistamines (Claritin) for now, and other non-prescription cough/cold preparations. May take Delsym Cough Suppressant ("12 Hour Cough Relief") at bedtime for nighttime cough.  Begin Azithromycin if not improving about 5 days or if persistent fever develops  (Given a prescription to hold, with an expiration date). Recommend using non-prescription earwax softening drops in left ear for several days.    ED Prescriptions     Medication Sig Dispense Auth. Provider   azithromycin (ZITHROMAX Z-PAK) 250 MG tablet Take 2 tabs today; then begin one tab once daily for 4 more days. 6 tablet Kandra Nicolas, MD      PDMP not reviewed this encounter.

## 2022-10-12 IMAGING — US US PELVIS COMPLETE TRANSABD/TRANSVAG W DUPLEX
1 series · 13 of 25 positions shown · non-contrast
Comparison: June 25, 2020

CLINICAL DATA: Pelvic pain.

EXAM:
TRANSABDOMINAL AND TRANSVAGINAL ULTRASOUND OF PELVIS
DOPPLER ULTRASOUND OF OVARIES
TECHNIQUE: Both transabdominal and transvaginal ultrasound examinations of the
pelvis were performed. Transabdominal technique was performed for
global imaging of the pelvis including uterus, ovaries, adnexal
regions, and pelvic cul-de-sac.
It was necessary to proceed with endovaginal exam following the
transabdominal exam to visualize the uterus, endometrium, bilateral
ovaries and bilateral adnexa. Color and duplex Doppler ultrasound
was utilized to evaluate blood flow to the ovaries.

[Series 1: us pelvis complete transabd/transvag w duplex · 13 of 72 slices shown]
[im 1/72]
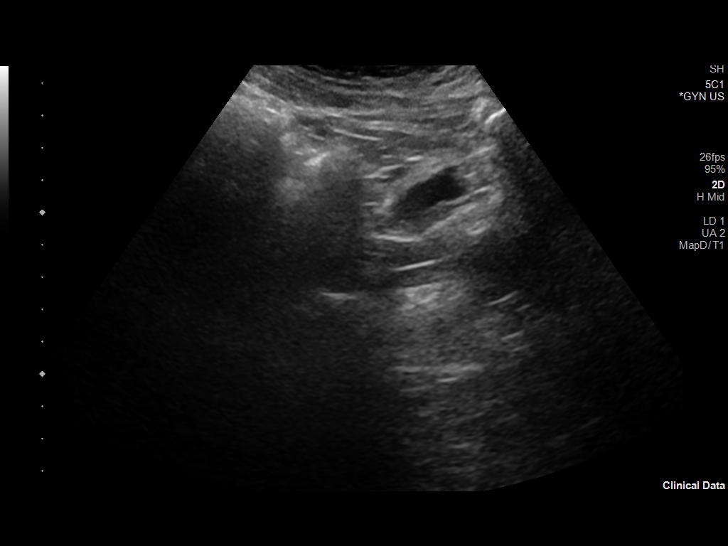
[im 6/72]
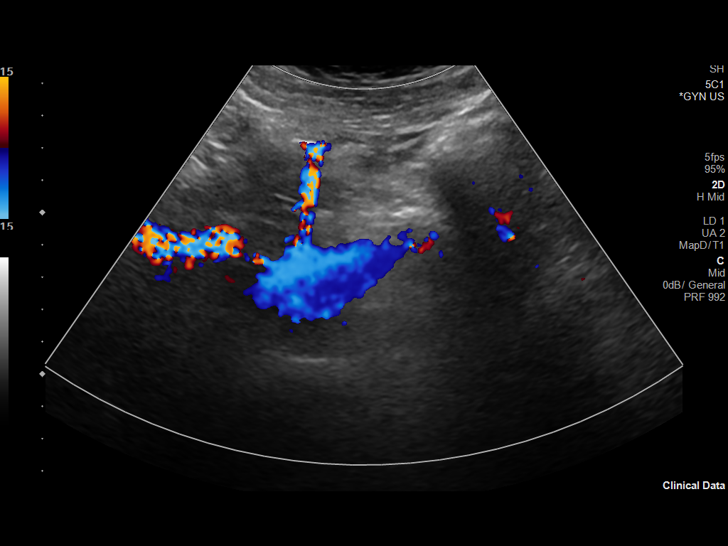
[im 12/72]
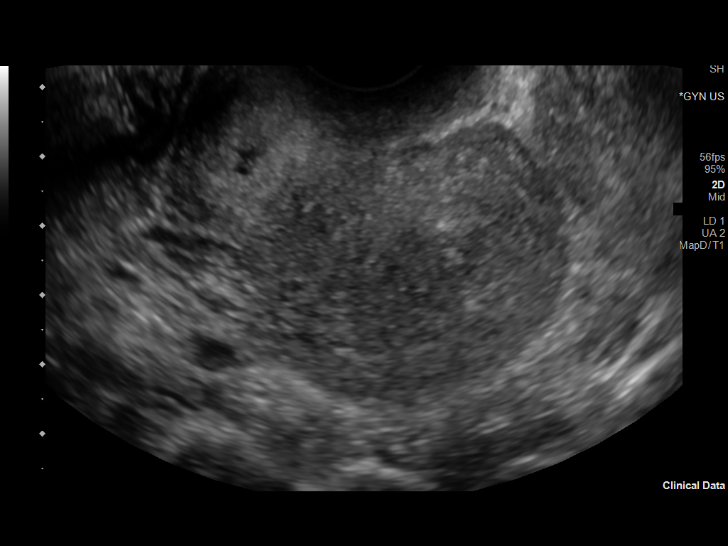
[im 18/72]
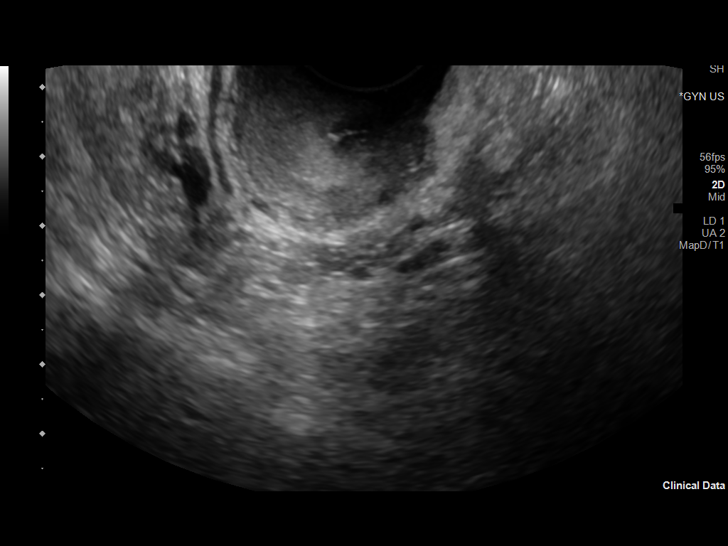
[im 24/72]
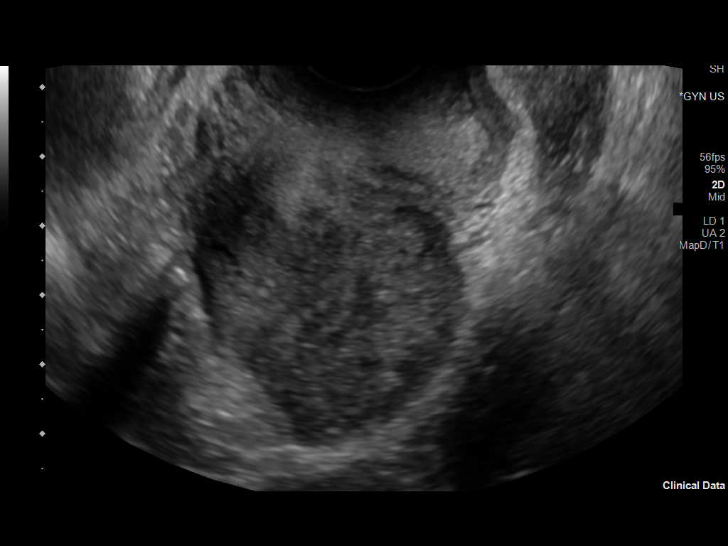
[im 30/72]
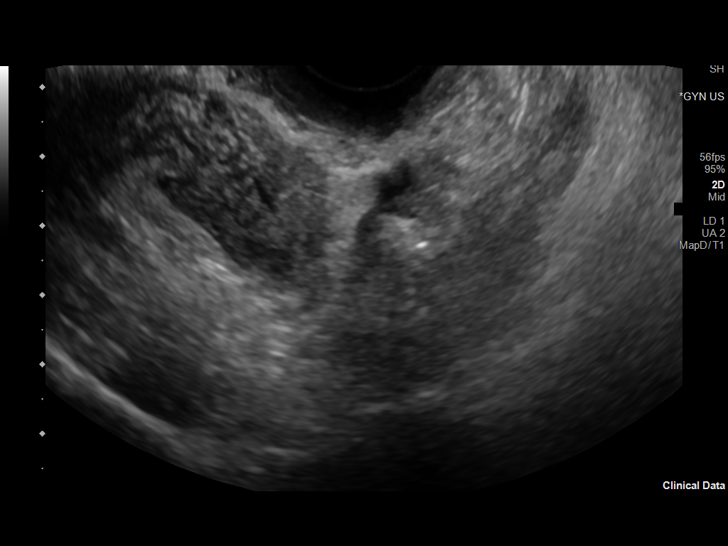
[im 36/72]
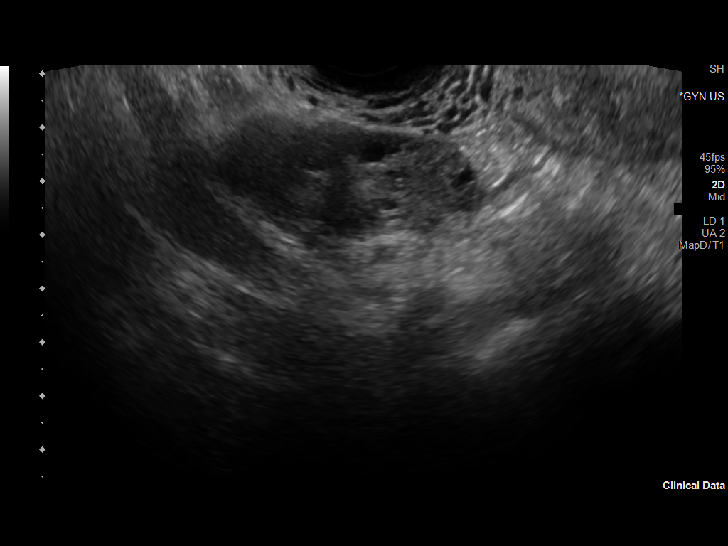
[im 42/72]
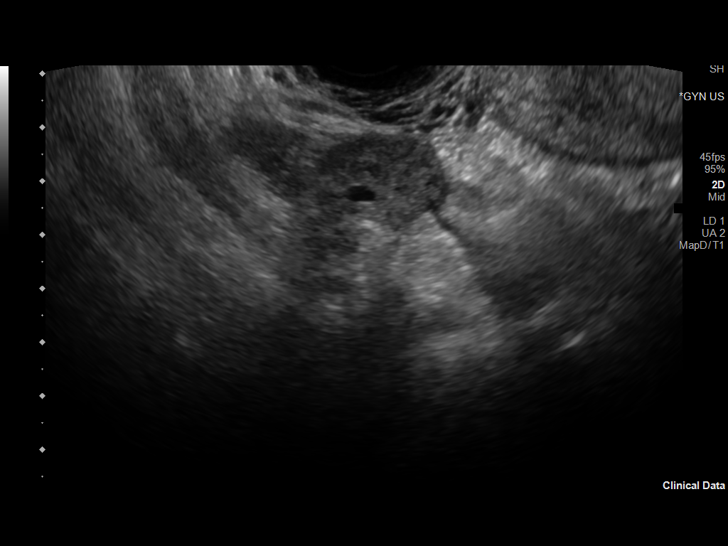
[im 48/72]
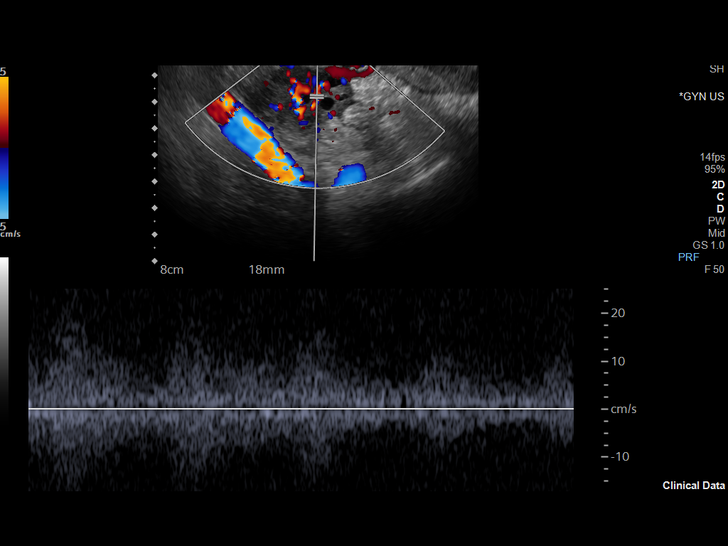
[im 54/72]
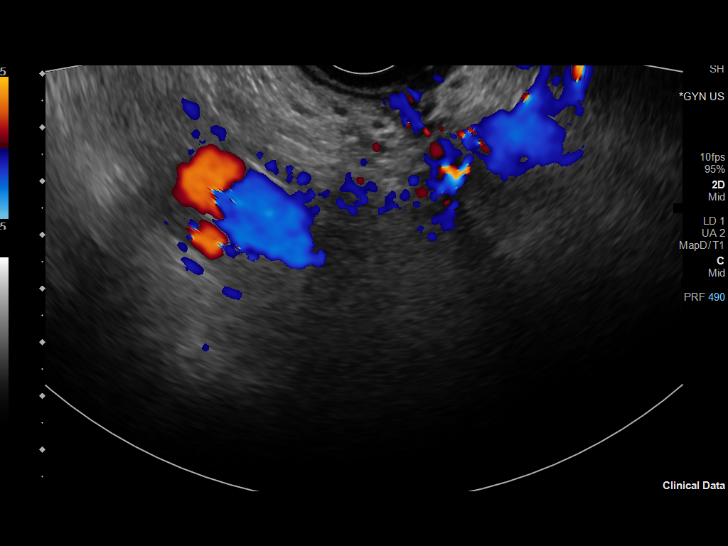
[im 60/72]
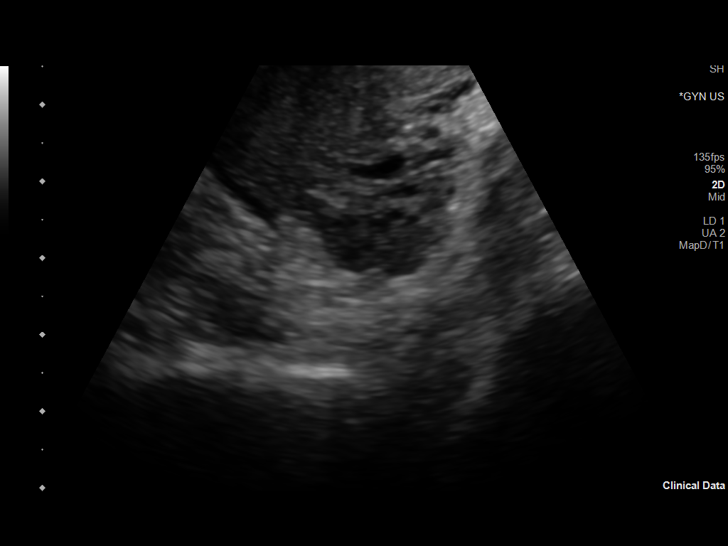
[im 66/72]
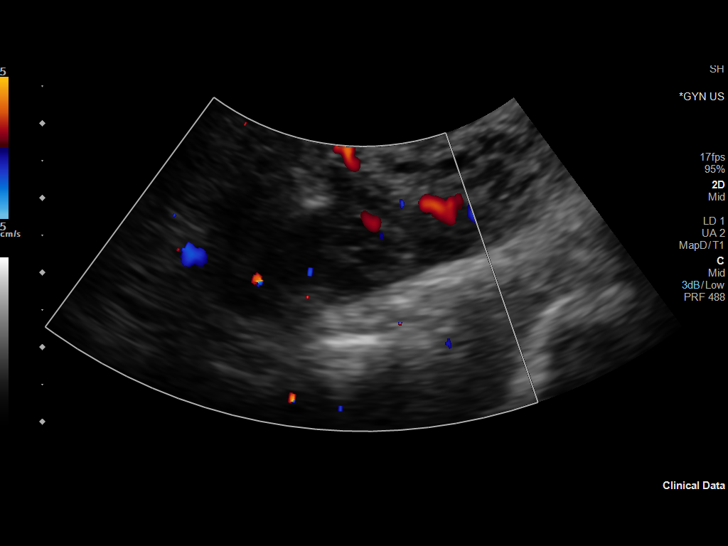
[im 72/72]
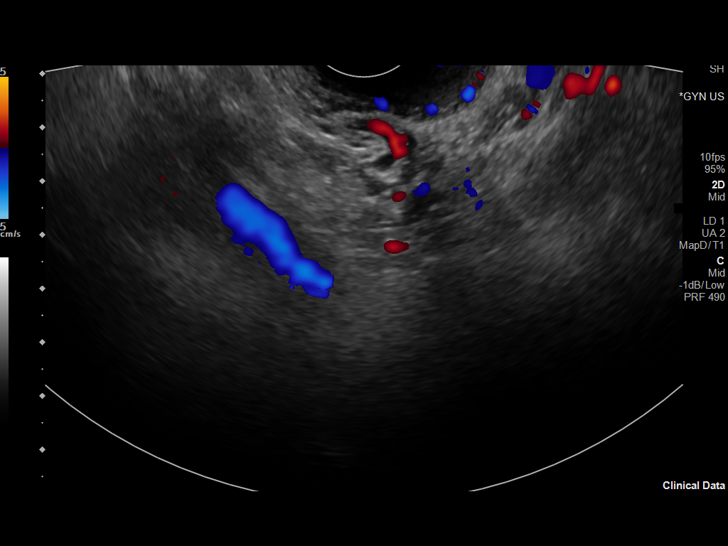

[13 of 25 positions shown; findings below may reference images not displayed]

FINDINGS: Uterus

Measurements: 6.6 cm x 5.3 cm x 4.4 cm = volume: 81 mL. No fibroids
or other mass visualized.

Endometrium

Thickness: 5.0 mm.  No focal abnormality visualized.

Right ovary

Measurements: 4.5 cm x 2.4 cm x 4.1 cm = volume: 22.3 mL. Normal
appearance/no adnexal mass.

Left ovary

Measurements: 3.1 cm x 2.0 cm x 2.9 cm = volume: 9.4 mL. Normal
appearance/no adnexal mass.

Pulsed Doppler evaluation of both ovaries demonstrates normal
low-resistance arterial and venous waveforms.

Other findings

No abnormal free fluid.
IMPRESSION: Normal pelvic ultrasound.

## 2022-10-12 IMAGING — CT CT ABD-PELV W/ CM
2 of 4 series · 16 of 46 positions shown, 18 images · IV contrast (Omnipaque)
Comparison: None.

CLINICAL DATA: Right lower quadrant abdominal pain. Appendicitis
suspected.

EXAM:
CT ABDOMEN AND PELVIS WITH CONTRAST
TECHNIQUE: Multidetector CT imaging of the abdomen and pelvis was performed
using the standard protocol following bolus administration of
intravenous contrast.
CONTRAST:  85mL OMNIPAQUE IOHEXOL 350 MG/ML SOLN

[Series 2: axial st · axial · 0.87mm/px · z∈[-674,-240]mm · 13 of 95 slices shown, 15 images]
[im 4/95  soft-tissue]
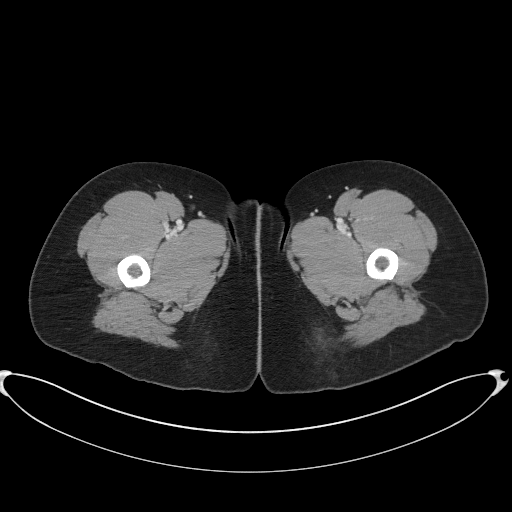
[im 4/95  bone]
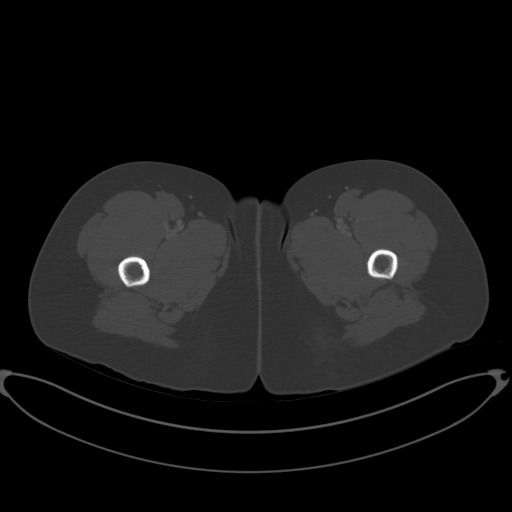
[im 12/95  soft-tissue]
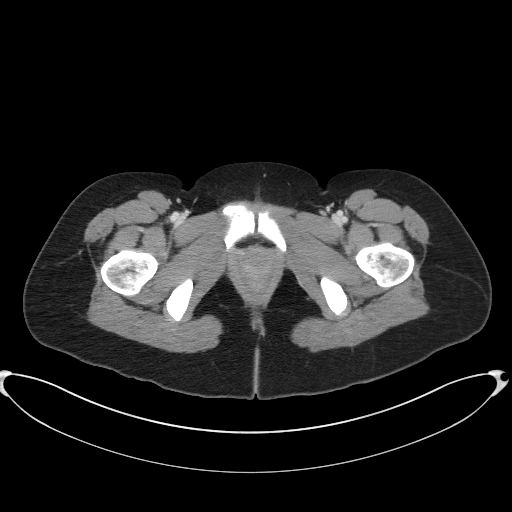
[im 20/95  soft-tissue]
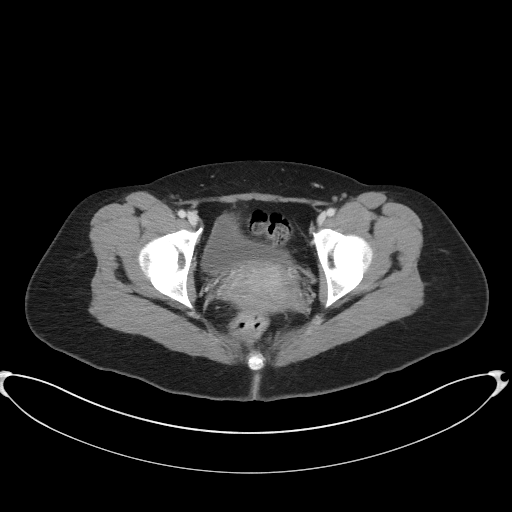
[im 28/95  soft-tissue]
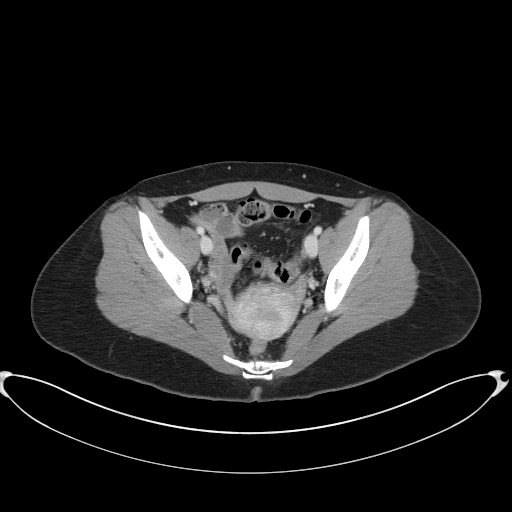
[im 32/95  soft-tissue]
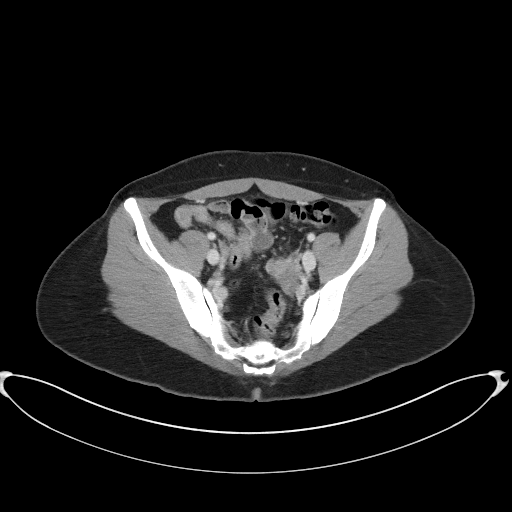
[im 40/95  soft-tissue]
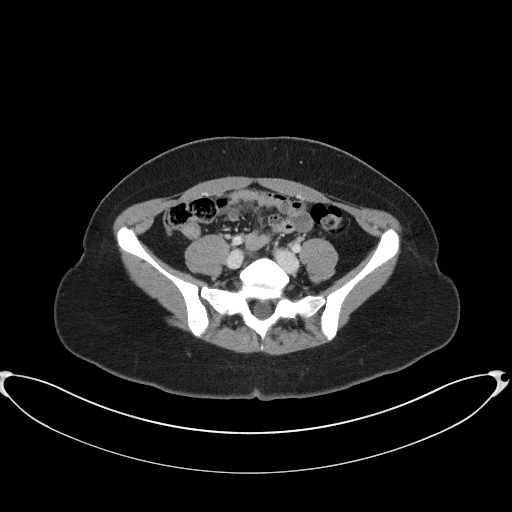
[im 48/95  soft-tissue]
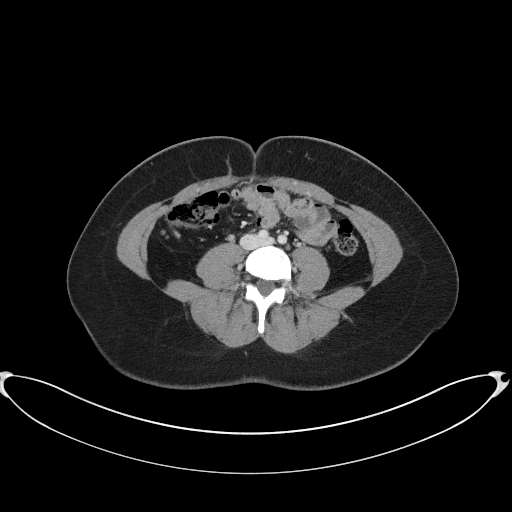
[im 55/95  soft-tissue]
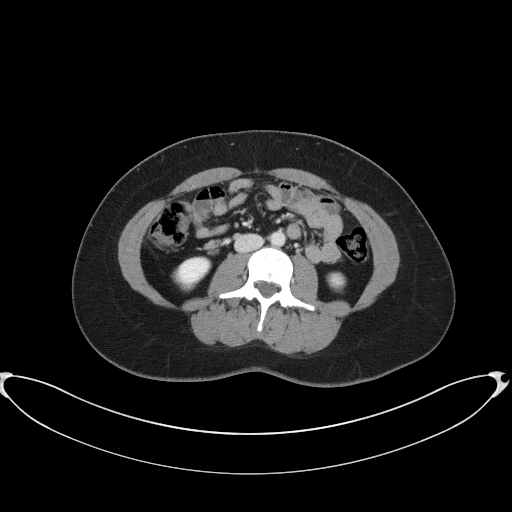
[im 63/95  soft-tissue]
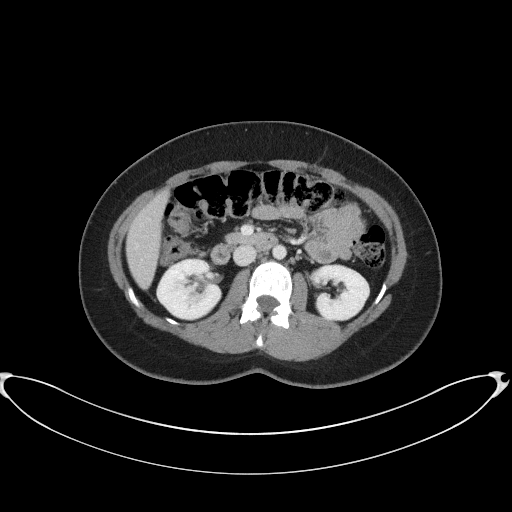
[im 63/95  bone]
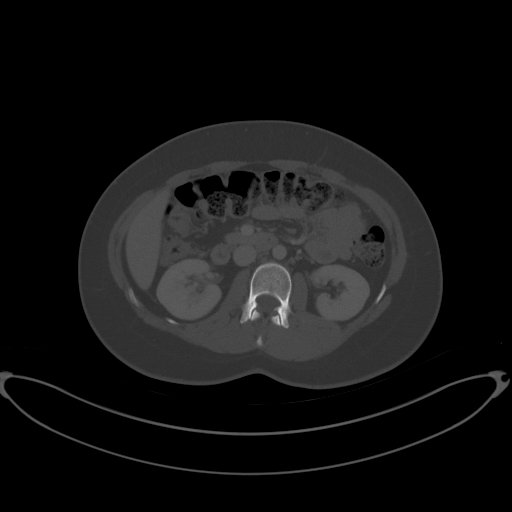
[im 67/95  soft-tissue]
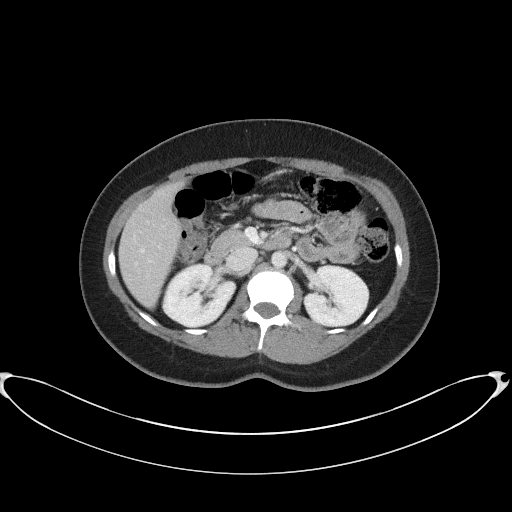
[im 75/95  soft-tissue]
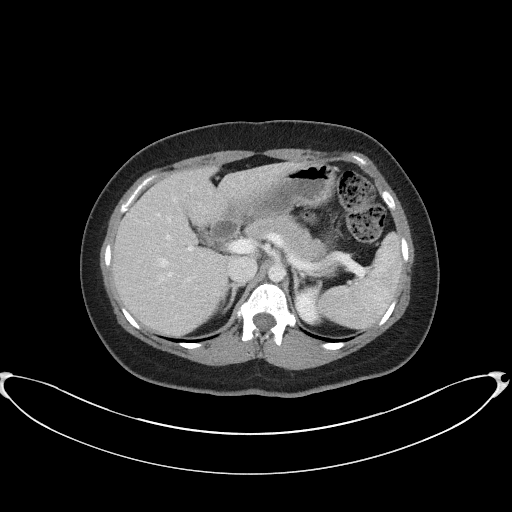
[im 83/95  soft-tissue]
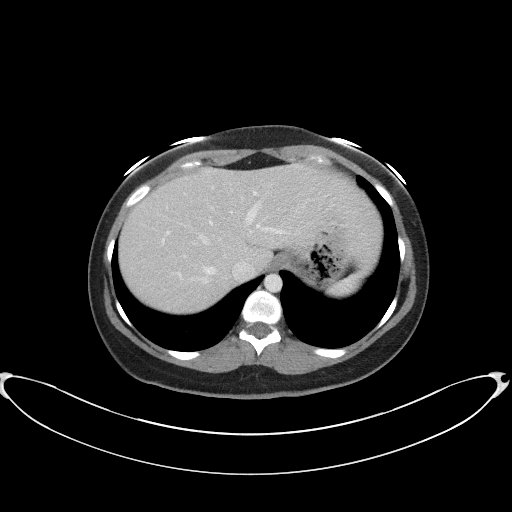
[im 91/95  soft-tissue]
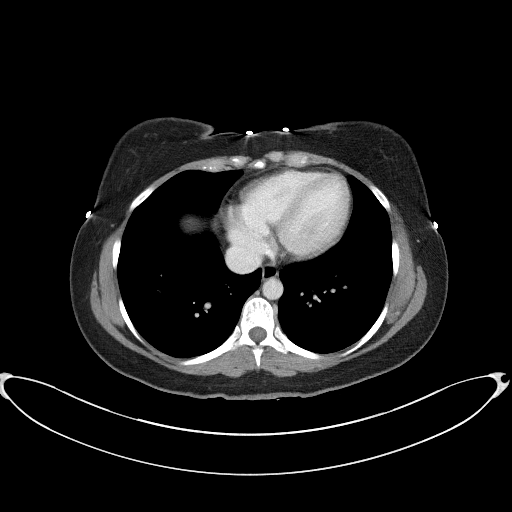

[Series 5: coronal st · coronal · 0.70mm/px · 3 of 101 slices shown]
[im 34/101  soft-tissue]
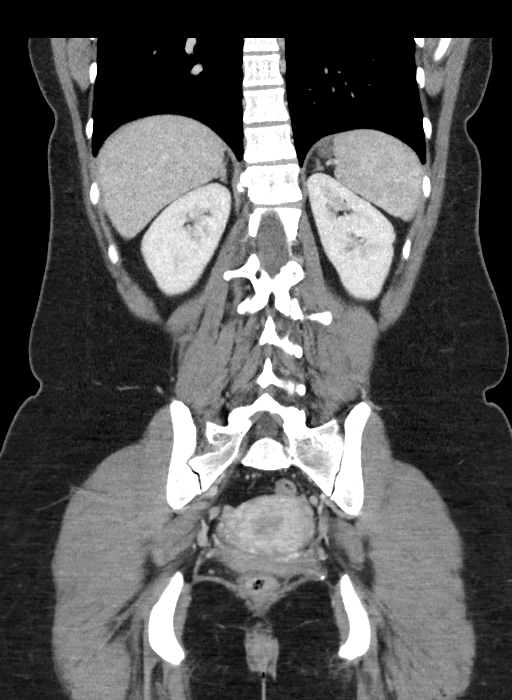
[im 45/101  soft-tissue]
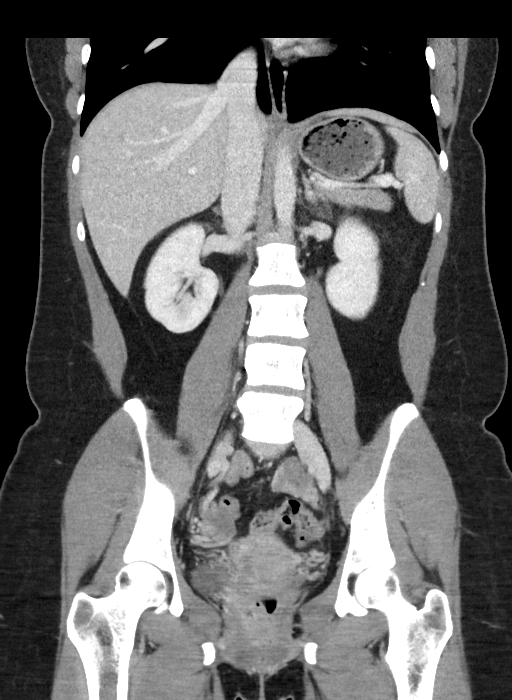
[im 56/101  soft-tissue]
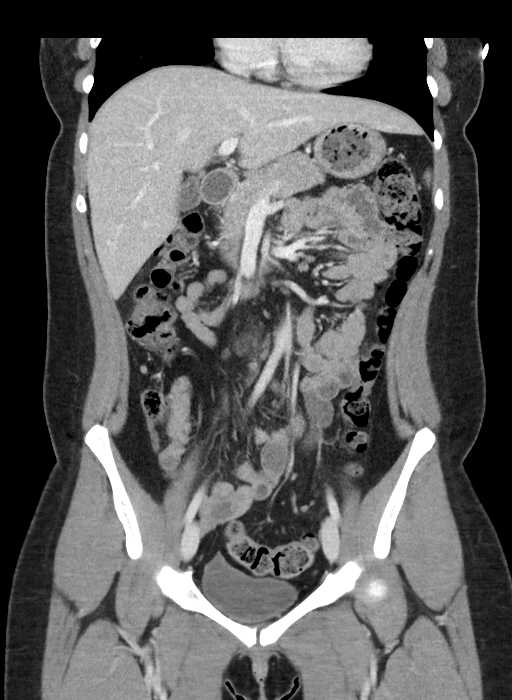

[16 of 46 positions shown; findings below may reference images not displayed]

FINDINGS: Lower chest: Lung bases are clear.

Hepatobiliary: No focal liver abnormality is seen. No gallstones,
gallbladder wall thickening, or biliary dilatation.

Pancreas: Unremarkable. No pancreatic ductal dilatation or
surrounding inflammatory changes.

Spleen: Normal in size without focal abnormality.

Adrenals/Urinary Tract: Adrenal glands are unremarkable. Kidneys are
normal, without renal calculi, focal lesion, or hydronephrosis.
Bladder is unremarkable.

Stomach/Bowel: Stomach, small bowel, and colon are not abnormally
distended. Scattered stool in the colon. No wall thickening or
inflammatory changes. Appendix is normal.

Vascular/Lymphatic: No significant vascular findings are present. No
enlarged abdominal or pelvic lymph nodes.

Reproductive: Uterus and bilateral adnexa are unremarkable.

Other: No free air or free fluid in the abdomen. Abdominal wall
musculature appears intact.

Musculoskeletal: No acute or significant osseous findings.
IMPRESSION: No acute process demonstrated in the abdomen or pelvis. No evidence
of bowel obstruction or inflammation. The appendix is normal.

## 2022-10-28 ENCOUNTER — Ambulatory Visit: Payer: Self-pay | Admitting: Nurse Practitioner

## 2022-10-28 ENCOUNTER — Telehealth: Payer: Self-pay | Admitting: Nurse Practitioner

## 2022-10-28 NOTE — Telephone Encounter (Signed)
Noted  

## 2022-10-28 NOTE — Telephone Encounter (Signed)
Pt was a no show for an OV with Lauren on 10/28/22, I sent a letter.

## 2022-10-28 NOTE — Telephone Encounter (Signed)
2nd no show, cannot bill fee ([facility] 2ndary), letter sent via mail and [employer]

## 2022-11-07 ENCOUNTER — Encounter: Payer: Self-pay | Admitting: Nurse Practitioner

## 2022-11-07 ENCOUNTER — Ambulatory Visit: Payer: Medicaid Other | Admitting: Nurse Practitioner

## 2022-11-07 VITALS — BP 126/82 | HR 76 | Temp 98.4°F | Ht 62.0 in | Wt 168.4 lb

## 2022-11-07 DIAGNOSIS — R5382 Chronic fatigue, unspecified: Secondary | ICD-10-CM | POA: Diagnosis not present

## 2022-11-07 DIAGNOSIS — G44229 Chronic tension-type headache, not intractable: Secondary | ICD-10-CM

## 2022-11-07 NOTE — Patient Instructions (Signed)
It was great to see you!  We are checking your labs today and will let you know the results via mychart/phone.   Start a magnesium supplement at bedtime.   Let's follow-up in 1-2 months, sooner if you have concerns.  If a referral was placed today, you will be contacted for an appointment. Please note that routine referrals can sometimes take up to 3-4 weeks to process. Please call our office if you haven't heard anything after this time frame.  Take care,  Vance Peper, NP

## 2022-11-07 NOTE — Progress Notes (Unsigned)
   Acute Office Visit  Subjective:     Patient ID: Patricia Cole, female    DOB: 01-28-1996, 27 y.o.   MRN: [id number]  Chief Complaint  Patient presents with   Headache    Ongoing for 6 months, fatigue, trouble concentrating    HPI Patient is in today for headaches almost daily. Tired. Fatigued. Foggy headed. Hot flashes that tend to happen at night, sometimes throughout the day. 6 months. Denies fevers, coughing, nasal congestion. Denies snoring. Falls asleep easily.   ROS      Objective:    BP 126/82 (BP Location: Right Arm)   Pulse 76   Temp 98.4 F (36.9 C)   Ht 5' 2"$  (1.575 m)   Wt 168 lb 6.4 oz (76.4 kg)   LMP 10/28/2022   SpO2 97%   BMI 30.80 kg/m  {Vitals History (Optional):23777}  Physical Exam  No results found for any visits on 11/07/22.      Assessment & Plan:   Problem List Items Addressed This Visit   None   No orders of the defined types were placed in this encounter.   No follow-ups on file.  Charyl Dancer, NP

## 2022-11-08 ENCOUNTER — Encounter: Payer: Self-pay | Admitting: Nurse Practitioner

## 2022-11-08 LAB — COMPREHENSIVE METABOLIC PANEL
ALT: 12 U/L (ref 0–35)
AST: 13 U/L (ref 0–37)
Albumin: 4.3 g/dL (ref 3.5–5.2)
Alkaline Phosphatase: 79 U/L (ref 39–117)
BUN: 15 mg/dL (ref 6–23)
CO2: 28 mEq/L (ref 19–32)
Calcium: 10.3 mg/dL (ref 8.4–10.5)
Chloride: 101 mEq/L (ref 96–112)
Creatinine, Ser: 0.67 mg/dL (ref 0.40–1.20)
GFR: 119.47 mL/min (ref >60.00)
Glucose, Bld: 93 mg/dL (ref 70–99)
Potassium: 4.2 mEq/L (ref 3.5–5.1)
Sodium: 139 mEq/L (ref 135–145)
Total Bilirubin: 0.3 mg/dL (ref 0.2–1.2)
Total Protein: 7.9 g/dL (ref 6.0–8.3)

## 2022-11-08 LAB — IRON,TIBC AND FERRITIN PANEL
%SAT: 9 % (calc) — ABNORMAL LOW (ref 16–45)
Ferritin: 37 ng/mL (ref 16–154)
Iron: 32 ug/dL — ABNORMAL LOW (ref 40–190)
TIBC: 375 mcg/dL (calc) (ref 250–450)

## 2022-11-08 LAB — CBC WITH DIFFERENTIAL/PLATELET
Basophils Absolute: 0.1 10*3/uL (ref 0.0–0.1)
Basophils Relative: 0.6 % (ref 0.0–3.0)
Eosinophils Absolute: 0.1 10*3/uL (ref 0.0–0.7)
Eosinophils Relative: 1.1 % (ref 0.0–5.0)
HCT: 38.2 % (ref 36.0–46.0)
Hemoglobin: 12.7 g/dL (ref 12.0–15.0)
Lymphocytes Relative: 41.2 % (ref 12.0–46.0)
Lymphs Abs: 4.1 10*3/uL — ABNORMAL HIGH (ref 0.7–4.0)
MCHC: 33.2 g/dL (ref 30.0–36.0)
MCV: 85.6 fl (ref 78.0–100.0)
Monocytes Absolute: 0.7 10*3/uL (ref 0.1–1.0)
Monocytes Relative: 7.4 % (ref 3.0–12.0)
Neutro Abs: 4.9 10*3/uL (ref 1.4–7.7)
Neutrophils Relative %: 49.7 % (ref 43.0–77.0)
Platelets: 597 10*3/uL — ABNORMAL HIGH (ref 150.0–400.0)
RBC: 4.46 Mil/uL (ref 3.87–5.11)
RDW: 13.4 % (ref 11.5–15.5)
WBC: 9.8 10*3/uL (ref 4.0–10.5)

## 2022-11-08 LAB — TSH: TSH: 1.06 u[IU]/mL (ref 0.35–5.50)

## 2022-11-08 LAB — VITAMIN D 25 HYDROXY (VIT D DEFICIENCY, FRACTURES): VITD: 17.49 ng/mL — ABNORMAL LOW (ref 30.00–100.00)

## 2022-11-08 LAB — VITAMIN B12: Vitamin B-12: 773 pg/mL (ref 211–911)

## 2022-11-08 MED ORDER — VITAMIN D (ERGOCALCIFEROL) 1.25 MG (50000 UNIT) PO CAPS: 50000.0000 [IU] | ORAL_CAPSULE | ORAL | 0 refills | Status: DC

## 2022-11-08 NOTE — Addendum Note (Signed)
Addended by: Vance Peper A on: 11/08/2022 12:15 PM   Modules accepted: Orders

## 2022-11-11 ENCOUNTER — Ambulatory Visit: Payer: Self-pay | Admitting: Nurse Practitioner

## 2022-12-01 ENCOUNTER — Encounter: Payer: Self-pay | Admitting: Nurse Practitioner

## 2022-12-01 ENCOUNTER — Telehealth (INDEPENDENT_AMBULATORY_CARE_PROVIDER_SITE_OTHER): Payer: Medicaid Other | Admitting: Nurse Practitioner

## 2022-12-01 VITALS — Ht 62.0 in | Wt 176.0 lb

## 2022-12-01 DIAGNOSIS — E669 Obesity, unspecified: Secondary | ICD-10-CM | POA: Diagnosis not present

## 2022-12-01 DIAGNOSIS — N926 Irregular menstruation, unspecified: Secondary | ICD-10-CM | POA: Diagnosis not present

## 2022-12-01 NOTE — Progress Notes (Signed)
[facility] [address] [place] [address] Dept: [phone] Dept Fax: [phone]  Virtual Video Visit  I connected with [name] on 12/04/22 at  2:00 PM EDT by a video enabled telemedicine application and verified that I am speaking with the correct person using two identifiers.  Location patient: Home Location provider: Clinic Persons participating in the virtual visit: Patient; Vance Peper, NP; Eino Farber, CMA  I discussed the limitations of evaluation and management by telemedicine and the availability of in person appointments. The patient expressed understanding and agreed to proceed.  Chief Complaint  Patient presents with   Weight Management Screening    Wants to discuss weight loss medications options    SUBJECTIVE:  HPI: Patricia Cole is a 27 y.o. female who presents to discuss weight loss medication options.   She has been gaining weight since she had her daughter 4 years ago. She went to a weight management place and did phentermine. She had depression and anxiety side effects. She has also tried metformin which made her nauseous. She has been drinking protein shakes and doing meal prep with lean meats. She has also been brining her lunch to work. She works out 5 days a week including 10-15 minutes of cardio and resistance training for about 30 minutes.  Her menstrual periods can be irregular and will sometimes have them twice a month. She has been told she her AMH levels were high when she was an egg donor in the past. She denies excess hair growth and other symptoms of PCOS.    Patient Active Problem List   Diagnosis Date Noted   Obesity (BMI 30-39.9) 12/01/2022   Overweight 11/12/2021   Sterilization consult 09/27/2021   Hemorrhoids 12/10/2020   HSV-1 infection 12/16/2015    Past Surgical History:  Procedure Laterality Date   WISDOM TOOTH EXTRACTION      Family History   Problem Relation Age of Onset   Breast cancer Maternal Grandmother    Breast cancer Paternal Grandmother     Social History   Tobacco Use   Smoking status: Never   Smokeless tobacco: Never  Vaping Use   Vaping Use: Never used  Substance Use Topics   Alcohol use: Not Currently    Comment: occ   Drug use: No     Current Outpatient Medications:    Multiple Vitamin (MULTIVITAMIN PO), Take by mouth., Disp: , Rfl:    Vitamin D, Ergocalciferol, (DRISDOL) 1.25 MG (50000 UNIT) CAPS capsule, Take 1 capsule (50,000 Units total) by mouth every 7 (seven) days., Disp: 12 capsule, Rfl: 0   ibuprofen (ADVIL,MOTRIN) 600 MG tablet, Take 1 tablet (600 mg total) by mouth every 6 (six) hours. (Patient not taking: Reported on 08/03/2021), Disp: 30 tablet, Rfl: 0  Allergies  Allergen Reactions   Morphine Hives, Itching and Shortness Of Breath   Other Rash   Tape Rash   Wound Dressing Adhesive Rash   Metformin Other (See Comments)   Phenergan [Promethazine] Hives    ROS: See pertinent positives and negatives per HPI.  OBSERVATIONS/OBJECTIVE:  VITALS per patient if applicable: Today's Vitals   12/01/22 1359  Weight: 176 lb (79.8 kg)  Height: 5\' 2"  (1.575 m)   Body mass index is 32.19 kg/m.    GENERAL: Alert and oriented. Appears well and in no acute distress.  HEENT: Atraumatic. Conjunctiva clear. No obvious abnormalities on inspection of external nose and ears.  NECK: Normal movements of the head  and neck.  LUNGS: On inspection, no signs of respiratory distress. Breathing rate appears normal. No obvious gross SOB, gasping or wheezing, and no conversational dyspnea.  CV: No obvious cyanosis.  MS: Moves all visible extremities without noticeable abnormality.  PSYCH/NEURO: Pleasant and cooperative. No obvious depression or anxiety. Speech and thought processing grossly intact.  ASSESSMENT AND PLAN:  Problem List Items Addressed This Visit       Other   Obesity (BMI 30-39.9)     Continue with meal prep and exercise. Try to limit calories and increase protein. Discussed weight loss medications including Contrave and Zepbound. She has tried phentermine and metformin in the past, however had side effects. She would like to think about medications and reach out when she decides.       Relevant Orders   Amb Ref to Medical Weight Management   Other Visit Diagnoses     Irregular periods    -  Primary   Irreuglar periods, sometimes she will have 2 per month. Possibility of PCOS. She can discuss this with GYN.        I discussed the assessment and treatment plan with the patient. The patient was provided an opportunity to ask questions and all were answered. The patient agreed with the plan and demonstrated an understanding of the instructions.   The patient was advised to call back or seek an in-person evaluation if the symptoms worsen or if the condition fails to improve as anticipated.   Charyl Dancer, NP

## 2022-12-01 NOTE — Patient Instructions (Signed)
It was great to see you!  You can look into Contrave weight loss pill and let me know if you would like to try this medication.  Let's follow-up at your next scheduled visit.   Take care,  Vance Peper, NP

## 2022-12-04 NOTE — Assessment & Plan Note (Signed)
Continue with meal prep and exercise. Try to limit calories and increase protein. Discussed weight loss medications including Contrave and Zepbound. She has tried phentermine and metformin in the past, however had side effects. She would like to think about medications and reach out when she decides.

## 2022-12-08 MED ORDER — NALTREXONE-BUPROPION HCL ER 8-90 MG PO TB12: 2.0000 | ORAL_TABLET | Freq: Two times a day (BID) | ORAL | 1 refills | Status: DC

## 2023-01-06 ENCOUNTER — Telehealth: Payer: Self-pay | Admitting: Nurse Practitioner

## 2023-01-06 ENCOUNTER — Ambulatory Visit: Payer: Medicaid Other | Admitting: Nurse Practitioner

## 2023-01-06 NOTE — Telephone Encounter (Signed)
4.26.24 pt is a no she w/ medicaid so no letter has been mailed out

## 2023-01-09 ENCOUNTER — Encounter: Payer: Self-pay | Admitting: Nurse Practitioner

## 2023-01-09 NOTE — Telephone Encounter (Signed)
Noted  

## 2023-01-09 NOTE — Telephone Encounter (Signed)
3rd no show, cannot bill fee [facility] plan), dismissal generated

## 2023-01-16 ENCOUNTER — Ambulatory Visit: Payer: Medicaid Other | Admitting: Obstetrics & Gynecology

## 2023-01-16 NOTE — Telephone Encounter (Signed)
Today 01/16/23 we got back pt's dismissal letter,

## 2023-01-18 NOTE — Telephone Encounter (Signed)
I have routed the dismissal to patient via mychart and notified her that the letter mailed was returned to [place] by [place] [employer].

## 2023-01-30 ENCOUNTER — Telehealth: Payer: Self-pay | Admitting: *Deleted

## 2023-01-30 NOTE — Telephone Encounter (Signed)
Left patient a message to call the office to move appointment to 1:30 from 1:50 on 02/02/2023.

## 2023-02-02 ENCOUNTER — Ambulatory Visit: Payer: Medicaid Other | Admitting: Family Medicine

## 2023-02-02 ENCOUNTER — Encounter: Payer: Self-pay | Admitting: Family Medicine

## 2023-02-02 VITALS — BP 134/84 | HR 77 | Ht 62.0 in | Wt 174.0 lb

## 2023-02-02 DIAGNOSIS — N981 Hyperstimulation of ovaries: Secondary | ICD-10-CM | POA: Diagnosis not present

## 2023-02-02 NOTE — Assessment & Plan Note (Signed)
This seems a prolonged case and some persistent features are reported in the literature. Will check u/s and ovaries as well as labs to ensure no enlargement of ovaries, no ascites (though none on PE today), and no lingering labs issues with kidneys or liver. Consider consultation with REI around next steps.

## 2023-02-02 NOTE — Progress Notes (Signed)
   Subjective:    Patient ID: Patricia Cole is a 27 y.o. female presenting with Ovarian hyperstimulation syndrome  on 02/02/2023  HPI: Had OHSS with egg donation in [place]. She reports this was 10/20/22. Hospitalized x 1 day with ovarian pain, bloating, and shortness of breath. Received IV fluids.  Donated twice before. This time felt very different. She gained 35 pounds. Having hot flashes, face feels flushed. She is having headaches and pelvic pain, abd. Bloating that have not resolved. Still has not gotten back to normal weight. She reports emotional instability as well. Cycles have been normal since returning except skipped April cycle.  Review of Systems  Constitutional:  Negative for chills and fever.  Respiratory:  Negative for shortness of breath.   Cardiovascular:  Negative for chest pain.  Gastrointestinal:  Negative for abdominal pain, nausea and vomiting.  Genitourinary:  Negative for dysuria.  Skin:  Negative for rash.      Objective:    BP 134/84   Pulse 77   Ht 5\' 2"  (1.575 m)   Wt 174 lb (78.9 kg)   LMP 01/20/2023   BMI 31.83 kg/m  Physical Exam Exam conducted with a chaperone present.  Constitutional:      General: She is not in acute distress.    Appearance: She is well-developed.  HENT:     Head: Normocephalic and atraumatic.  Eyes:     General: No scleral icterus. Cardiovascular:     Rate and Rhythm: Normal rate.  Pulmonary:     Effort: Pulmonary effort is normal.     Breath sounds: Normal breath sounds.  Abdominal:     Palpations: Abdomen is soft. There is no shifting dullness, fluid wave, hepatomegaly, splenomegaly or mass.     Tenderness: There is no abdominal tenderness.  Musculoskeletal:     Cervical back: Neck supple.  Skin:    General: Skin is warm and dry.  Neurological:     Mental Status: She is alert and oriented to person, place, and time.         Assessment & Plan:   Problem List Items Addressed This Visit        Unprioritized   Hyperstimulation of ovaries - Primary    This seems a prolonged case and some persistent features are reported in the literature. Will check u/s and ovaries as well as labs to ensure no enlargement of ovaries, no ascites (though none on PE today), and no lingering labs issues with kidneys or liver. Consider consultation with REI around next steps.      Relevant Orders   TSH   Follicle stimulating hormone   US PELVIC COMPLETE WITH TRANSVAGINAL   CBC   Comprehensive metabolic panel   Protime-INR     Return if symptoms worsen or fail to improve.  Reva Bores, MD 02/02/2023 1:14 PM

## 2023-02-03 LAB — COMPREHENSIVE METABOLIC PANEL
ALT: 15 IU/L (ref 0–32)
AST: 20 IU/L (ref 0–40)
Albumin/Globulin Ratio: 1.5 (ref 1.2–2.2)
Albumin: 4.5 g/dL (ref 4.0–5.0)
Alkaline Phosphatase: 86 IU/L (ref 44–121)
BUN/Creatinine Ratio: 15 (ref 9–23)
BUN: 10 mg/dL (ref 6–20)
Bilirubin Total: 0.2 mg/dL (ref 0.0–1.2)
CO2: 24 mmol/L (ref 20–29)
Calcium: 9.3 mg/dL (ref 8.7–10.2)
Chloride: 102 mmol/L (ref 96–106)
Creatinine, Ser: 0.68 mg/dL (ref 0.57–1.00)
Globulin, Total: 3.1 g/dL (ref 1.5–4.5)
Glucose: 96 mg/dL (ref 70–99)
Potassium: 4.4 mmol/L (ref 3.5–5.2)
Sodium: 140 mmol/L (ref 134–144)
Total Protein: 7.6 g/dL (ref 6.0–8.5)
eGFR: 122 mL/min/{1.73_m2} (ref >59)

## 2023-02-03 LAB — CBC
Hematocrit: 39.7 % (ref 34.0–46.6)
Hemoglobin: 13 g/dL (ref 11.1–15.9)
MCH: 28.2 pg (ref 26.6–33.0)
MCHC: 32.7 g/dL (ref 31.5–35.7)
MCV: 86 fL (ref 79–97)
Platelets: 425 10*3/uL (ref 150–450)
RBC: 4.61 x10E6/uL (ref 3.77–5.28)
RDW: 13.2 % (ref 11.7–15.4)
WBC: 9.3 10*3/uL (ref 3.4–10.8)

## 2023-02-03 LAB — FOLLICLE STIMULATING HORMONE: FSH: 4.9 m[IU]/mL

## 2023-02-03 LAB — TSH: TSH: 1.61 u[IU]/mL (ref 0.450–4.500)

## 2023-02-09 ENCOUNTER — Encounter: Payer: Self-pay | Admitting: Family Medicine

## 2023-02-09 ENCOUNTER — Ambulatory Visit (INDEPENDENT_AMBULATORY_CARE_PROVIDER_SITE_OTHER): Payer: Medicaid Other

## 2023-02-09 DIAGNOSIS — N981 Hyperstimulation of ovaries: Secondary | ICD-10-CM | POA: Diagnosis not present

## 2023-03-09 ENCOUNTER — Telehealth: Payer: Self-pay | Admitting: *Deleted

## 2023-03-09 NOTE — Telephone Encounter (Signed)
Left patient a message about available appointment on 03/10/2023.

## 2023-03-10 ENCOUNTER — Encounter: Payer: Self-pay | Admitting: Obstetrics and Gynecology

## 2023-03-10 ENCOUNTER — Other Ambulatory Visit (HOSPITAL_COMMUNITY)
Admission: RE | Admit: 2023-03-10 | Discharge: 2023-03-10 | Disposition: A | Discharge status: Home / Self Care | Payer: Medicaid Other | Source: Ambulatory Visit | Attending: Obstetrics and Gynecology | Admitting: Obstetrics and Gynecology

## 2023-03-10 ENCOUNTER — Ambulatory Visit: Payer: Medicaid Other | Admitting: Obstetrics and Gynecology

## 2023-03-10 VITALS — BP 125/84 | HR 94 | Ht 62.0 in | Wt 170.0 lb

## 2023-03-10 DIAGNOSIS — N760 Acute vaginitis: Secondary | ICD-10-CM

## 2023-03-10 DIAGNOSIS — N898 Other specified noninflammatory disorders of vagina: Secondary | ICD-10-CM | POA: Diagnosis present

## 2023-03-10 DIAGNOSIS — B9689 Other specified bacterial agents as the cause of diseases classified elsewhere: Secondary | ICD-10-CM

## 2023-03-10 MED ORDER — FLUCONAZOLE 150 MG PO TABS: 150.0000 mg | ORAL_TABLET | Freq: Once | ORAL | 1 refills | Status: AC

## 2023-03-10 NOTE — Progress Notes (Signed)
   GYNECOLOGY PROGRESS NOTE  History:  27 y.o. G1P1001 presents to [facility] office today for problem gyn visit. She reports finishing Augmentin the beginning of the week and reports vaginal itching starting on Wednesday. She tried OTC boric acid with no relief.  She denies h/a, dizziness, shortness of breath, n/v, or fever/chills.    The following portions of the patient's history were reviewed and updated as appropriate: allergies, current medications, past family history, past medical history, past social history, past surgical history and problem list. Last pap smear on 11/28/19 was normal,   Health Maintenance Due  Topic Date Due   COVID-19 Vaccine (3 - 2023-24 season) 05/13/2022   PAP-Cervical Cytology Screening  11/28/2022   PAP SMEAR-Modifier  11/28/2022     Review of Systems:  Pertinent items are noted in HPI.   Objective:  Physical Exam Height 5\' 2"  (1.575 m). VS reviewed, nursing note reviewed,  Constitutional: well developed, well nourished, no distress HEENT: normocephalic CV: normal rate Pulm/chest wall: normal effort Breast Exam: deferred Abdomen: soft Neuro: alert and oriented x 3 Skin: warm, dry Psych: affect normal Pelvic exam: external genitalia normal, creamy white d/c noted, swab collected with chaperone present   Assessment & Plan:  1. Vaginal itching Diflucan sent to pharmacy for symptomatic tx. - Cervicovaginal ancillary only( [facility])  Is due for pap smear, will schedule with office time to come back.   Albertine Grates, FNP 8:07 AM

## 2023-03-13 ENCOUNTER — Other Ambulatory Visit: Payer: Self-pay

## 2023-03-13 DIAGNOSIS — N898 Other specified noninflammatory disorders of vagina: Secondary | ICD-10-CM

## 2023-03-13 LAB — CERVICOVAGINAL ANCILLARY ONLY
Bacterial Vaginitis (gardnerella): POSITIVE — AB
Candida Glabrata: NEGATIVE
Candida Vaginitis: POSITIVE — AB
Comment: NEGATIVE
Comment: NEGATIVE
Comment: NEGATIVE

## 2023-03-13 MED ORDER — FLUCONAZOLE 150 MG PO TABS
150.0000 mg | ORAL_TABLET | Freq: Once | ORAL | 0 refills | Status: AC
Start: 2023-03-13 — End: 2023-03-13

## 2023-03-14 MED ORDER — METRONIDAZOLE 500 MG PO TABS
500.0000 mg | ORAL_TABLET | Freq: Two times a day (BID) | ORAL | 0 refills | Status: AC
Start: 2023-03-14 — End: 2023-03-21

## 2023-03-14 NOTE — Addendum Note (Signed)
Addended by: Sue Lush on: 03/14/2023 08:15 AM   Modules accepted: Orders

## 2023-06-05 ENCOUNTER — Telehealth (HOSPITAL_BASED_OUTPATIENT_CLINIC_OR_DEPARTMENT_OTHER): Payer: Self-pay

## 2023-06-05 NOTE — Telephone Encounter (Signed)
lvm at [phone] to r/s appt due to provider out of the office

## 2023-06-08 ENCOUNTER — Ambulatory Visit (HOSPITAL_BASED_OUTPATIENT_CLINIC_OR_DEPARTMENT_OTHER): Payer: Medicaid Other | Admitting: Family Medicine

## 2023-06-12 ENCOUNTER — Ambulatory Visit
Admission: RE | Admit: 2023-06-12 | Discharge: 2023-06-12 | Disposition: A | Discharge status: Home / Self Care | Payer: Medicaid Other | Source: Ambulatory Visit | Attending: Internal Medicine | Admitting: Internal Medicine

## 2023-06-12 ENCOUNTER — Encounter: Payer: Self-pay | Admitting: Family Medicine

## 2023-06-12 ENCOUNTER — Ambulatory Visit (INDEPENDENT_AMBULATORY_CARE_PROVIDER_SITE_OTHER): Payer: Medicaid Other | Admitting: Family Medicine

## 2023-06-12 VITALS — BP 107/71 | HR 89 | Temp 98.2°F | Resp 18 | Ht 62.0 in | Wt 158.1 lb

## 2023-06-12 VITALS — BP 117/75 | HR 111 | Temp 98.8°F | Resp 18 | Ht 62.0 in | Wt 160.0 lb

## 2023-06-12 DIAGNOSIS — Z1152 Encounter for screening for COVID-19: Secondary | ICD-10-CM | POA: Insufficient documentation

## 2023-06-12 DIAGNOSIS — Z7689 Persons encountering health services in other specified circumstances: Secondary | ICD-10-CM | POA: Diagnosis not present

## 2023-06-12 DIAGNOSIS — R519 Headache, unspecified: Secondary | ICD-10-CM | POA: Diagnosis present

## 2023-06-12 DIAGNOSIS — B349 Viral infection, unspecified: Secondary | ICD-10-CM | POA: Diagnosis not present

## 2023-06-12 DIAGNOSIS — J029 Acute pharyngitis, unspecified: Secondary | ICD-10-CM | POA: Insufficient documentation

## 2023-06-12 DIAGNOSIS — F988 Other specified behavioral and emotional disorders with onset usually occurring in childhood and adolescence: Secondary | ICD-10-CM

## 2023-06-12 LAB — POCT RAPID STREP A (OFFICE): Rapid Strep A Screen: NEGATIVE

## 2023-06-12 LAB — POCT INFLUENZA A/B
Influenza A, POC: NEGATIVE
Influenza B, POC: NEGATIVE

## 2023-06-12 MED ORDER — ATOMOXETINE HCL 18 MG PO CAPS
18.0000 mg | ORAL_CAPSULE | Freq: Every day | ORAL | 1 refills | Status: DC
Start: 2023-06-12 — End: 2023-07-17

## 2023-06-12 NOTE — Progress Notes (Signed)
New Patient Office Visit  Subjective    Patient ID: Patricia Cole, female    DOB: 10/30/1995  Age: 27 y.o. 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 She is taking classes currently online and reports she has noticed more difficulty with completing tasks and focusing. She says she noticed being easily distracted with work back in June 2024 but this significantly worsened over the last several months when she enrolled in classes. She says it's hard for her to focus on any one thing. She is nervous about starting nursing school next year and would like to get a handle on this. She feels like she doesn't complete tasks in a timely manner. 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 She has a 5 year daughter.  Flowsheet Row Office Visit from 06/12/2023 in [facility]  PHQ-9 Total Score 0          06/12/2023    8:37 AM 11/12/2021    3:40 PM  GAD 7 : Generalized Anxiety Score  Nervous, Anxious, on Edge 0 0  Control/stop worrying 0 0  Worry too much - different things 0 0  Trouble relaxing 0 0  Restless 0 0  Easily annoyed or irritable 0 0  Afraid - awful might happen 0 0  Total GAD 7 Score 0 0  Anxiety Difficulty Not difficult at all        No outpatient encounter medications on file as of 06/12/2023.   No facility-administered encounter medications on file as of 06/12/2023.    Past Medical History:  Diagnosis Date   Anal fissure 12/10/2020   Hemorrhoid    Seasonal allergies     Past Surgical History:  Procedure Laterality Date   WISDOM TOOTH EXTRACTION      Family History  Problem Relation Age of Onset   Breast cancer Maternal Grandmother    Breast cancer Paternal Grandmother     Social  History   Socioeconomic History   Marital status: Married    Spouse name: Not on file   Number of children: Not on file   Years of education: Not on file   Highest education level: Not on file  Occupational History   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[employer]  Tobacco Use   Smoking status: Never    Passive exposure: Never   Smokeless tobacco: Never  Vaping Use   Vaping status: Never Used  Substance and Sexual Activity   Alcohol use: Yes    Comment: occ   Drug use: Never   Sexual activity: Yes    Birth control/protection: None  Other Topics Concern   Not on file  Social History Narrative   Not on file   Social Determinants of Health   Financial Resource Strain: Low Risk  (04/23/2018)   Overall Financial Resource Strain (CARDIA)    Difficulty of Paying Living Expenses: Not very hard  Food Insecurity: No Food Insecurity (04/23/2018)   Hunger Vital Sign    Worried About Running Out of Food in the Last Year: Never true    Ran Out of Food in the Last Year: Never true  Transportation Needs: Unknown (04/23/2018)   PRAPARE -  Administrator, Civil Service (Medical): Patient declined    Lack of Transportation (Non-Medical): Patient declined  Physical Activity: Inactive (04/23/2018)   Exercise Vital Sign    Days of Exercise per Week: 0 days    Minutes of Exercise per Session: 0 min  Stress: No Stress Concern Present (04/23/2018)   [employer] of Occupational Health - Occupational Stress Questionnaire    Feeling of Stress : Only a little  Social Connections: Unknown (01/22/2022)   Received from [facility]   Social Network    Social Network: Not on file  Intimate Partner Violence: Unknown (12/15/2021)   Received from [facility], [facility]   HITS    Physically Hurt: Not on file    Insult or Talk Down To: Not on file    Threaten Physical Harm: Not on file    Scream or Curse: Not on file    Review of Systems   Psychiatric/Behavioral:  Negative for depression. The patient is not nervous/anxious and does not have insomnia.        Inattention  All other systems reviewed and are negative.       Objective    BP 107/71   Pulse 89   Temp 98.2 F (36.8 C) (Oral)   Resp 18   Ht 5\' 2"  (1.575 m)   Wt 158 lb 1.6 oz (71.7 kg)   LMP 06/03/2023 (Exact Date)   SpO2 99%   BMI 28.92 kg/m   Physical Exam Vitals and nursing note reviewed.  Constitutional:      Appearance: Normal appearance. She is normal weight.  HENT:     Head: Normocephalic and atraumatic.     Right Ear: External ear normal.     Left Ear: External ear normal.     Nose: Nose normal.     Mouth/Throat:     Mouth: Mucous membranes are moist.     Pharynx: Oropharynx is clear.  Eyes:     Conjunctiva/sclera: Conjunctivae normal.     Pupils: Pupils are equal, round, and reactive to light.  Cardiovascular:     Rate and Rhythm: Normal rate and regular rhythm.     Pulses: Normal pulses.     Heart sounds: Normal heart sounds.  Pulmonary:     Effort: Pulmonary effort is normal.     Breath sounds: Normal breath sounds.  Abdominal:     General: Abdomen is flat. Bowel sounds are normal.  Skin:    General: Skin is warm.     Capillary Refill: Capillary refill takes less than 2 seconds.  Neurological:     General: No focal deficit present.     Mental Status: She is alert and oriented to person, place, and time. Mental status is at baseline.  Psychiatric:        Mood and Affect: Mood normal.        Behavior: Behavior normal.        Thought Content: Thought content normal.        Judgment: Judgment normal.       Assessment & Plan:   Problem List Items Addressed This Visit   None  Encounter to establish care with new doctor  Attention deficit disorder (ADD) without hyperactivity -     Atomoxetine HCl; Take 1 capsule (18 mg total) by mouth daily.  Dispense: 30 capsule; Refill: 1  Pt with symptoms of ADD without  hyperactivity. Will start trial of non-stimulant medication Strattera 18 mg daily. See back in 4  weeks for follow up.  No follow-ups on file.   Suzan Slick, MD

## 2023-06-12 NOTE — Discharge Instructions (Signed)
Rapid flu and rapid strep negative.  Throat culture and COVID test pending.  Suspect that you have a viral cause of your symptoms that should run its course as we discussed.  Ensure adequate fluid hydration and rest.  Follow-up if any symptoms persist or worsen.

## 2023-06-12 NOTE — ED Triage Notes (Signed)
Patient c/o sore throat, body aches, headache, stuffiness, "feels like I've been hit by a bus", woke up feeling like this.  Patient has taken Tylenol.

## 2023-06-12 NOTE — ED Provider Notes (Signed)
[facility]    CSN: [id number] Arrival date & time: 06/12/23  1755      History   Chief Complaint Chief Complaint  Patient presents with   Sore Throat    Entered by patient    HPI Patricia Cole is a 27 y.o. female.   Patient presents with sore throat, body aches, headache, nasal congestion that started this morning upon awakening.  She states that she had appointment with PCP today but they did not address this as they reported that she needed "more testing than they can be provided".  She has taken Tylenol for symptoms.  Denies any known sick contacts or fever.  Denies history of asthma.   Sore Throat    Past Medical History:  Diagnosis Date   Anal fissure 12/10/2020   Hemorrhoid    Seasonal allergies     Patient Active Problem List   Diagnosis Date Noted   Hyperstimulation of ovaries 02/02/2023   Obesity (BMI 30-39.9) 12/01/2022   Overweight 11/12/2021   Sterilization consult 09/27/2021   Hemorrhoids 12/10/2020    Past Surgical History:  Procedure Laterality Date   WISDOM TOOTH EXTRACTION      OB History     Gravida  1   Para  1   Term  1   Preterm      AB      Living  1      SAB      IAB      Ectopic      Multiple      Live Births  1            Home Medications    Prior to Admission medications   Medication Sig Start Date End Date Taking? Authorizing Provider  atomoxetine (STRATTERA) 18 MG capsule Take 1 capsule (18 mg total) by mouth daily. 06/12/23  Yes Rucker, Magdalen Spatz, MD    Family History Family History  Problem Relation Age of Onset   Breast cancer Maternal Grandmother    Breast cancer Paternal Grandmother     Social History Social History   Tobacco Use   Smoking status: Never    Passive exposure: Never   Smokeless tobacco: Never  Vaping Use   Vaping status: Never Used  Substance Use Topics   Alcohol use: Yes    Comment: occ   Drug use: Never     Allergies   Morphine, Other, Tape,  Wound dressing adhesive, Metformin, and Phenergan [promethazine]   Review of Systems Review of Systems Per HPI  Physical Exam Triage Vital Signs ED Triage Vitals  Encounter Vitals Group     BP 06/12/23 1840 117/75     Systolic BP Percentile --      Diastolic BP Percentile --      Pulse Rate 06/12/23 1840 (!) 111     Resp 06/12/23 1840 18     Temp 06/12/23 1840 98.8 F (37.1 C)     Temp Source 06/12/23 1840 Oral     SpO2 06/12/23 1840 97 %     Weight 06/12/23 1841 160 lb (72.6 kg)     Height 06/12/23 1841 5\' 2"  (1.575 m)     Head Circumference --      Peak Flow --      Pain Score 06/12/23 1841 5     Pain Loc --      Pain Education --      Exclude from Growth Chart --    No data found.  Updated Vital Signs BP 117/75 (BP Location: Right Arm)   Pulse (!) 111   Temp 98.8 F (37.1 C) (Oral)   Resp 18   Ht 5\' 2"  (1.575 m)   Wt 160 lb (72.6 kg)   LMP 06/03/2023 (Exact Date)   SpO2 97%   BMI 29.26 kg/m   Visual Acuity Right Eye Distance:   Left Eye Distance:   Bilateral Distance:    Right Eye Near:   Left Eye Near:    Bilateral Near:     Physical Exam Constitutional:      General: She is not in acute distress.    Appearance: Normal appearance. She is not toxic-appearing or diaphoretic.  HENT:     Head: Normocephalic and atraumatic.     Right Ear: Tympanic membrane and ear canal normal.     Left Ear: Tympanic membrane and ear canal normal.     Nose: Congestion present.     Mouth/Throat:     Mouth: Mucous membranes are moist.     Pharynx: Posterior oropharyngeal erythema present.  Eyes:     Extraocular Movements: Extraocular movements intact.     Conjunctiva/sclera: Conjunctivae normal.     Pupils: Pupils are equal, round, and reactive to light.  Cardiovascular:     Rate and Rhythm: Normal rate and regular rhythm.     Pulses: Normal pulses.     Heart sounds: Normal heart sounds.  Pulmonary:     Effort: Pulmonary effort is normal. No respiratory distress.      Breath sounds: Normal breath sounds. No stridor. No wheezing, rhonchi or rales.  Abdominal:     General: Abdomen is flat. Bowel sounds are normal.     Palpations: Abdomen is soft.  Musculoskeletal:        General: Normal range of motion.     Cervical back: Normal range of motion.  Skin:    General: Skin is warm and dry.  Neurological:     General: No focal deficit present.     Mental Status: She is alert and oriented to person, place, and time. Mental status is at baseline.  Psychiatric:        Mood and Affect: Mood normal.        Behavior: Behavior normal.      UC Treatments / Results  Labs (all labs ordered are listed, but only abnormal results are displayed) Labs Reviewed  CULTURE, GROUP A STREP ([facility])  SARS CORONAVIRUS 2 (TAT 6-24 HRS)  POCT RAPID STREP A (OFFICE)  POCT INFLUENZA A/B    EKG   Radiology No results found.  Procedures Procedures (including critical care time)  Medications Ordered in UC Medications - No data to display  Initial Impression / Assessment and Plan / UC Course  I have reviewed the triage vital signs and the nursing notes.  Pertinent labs & imaging results that were available during my care of the patient were reviewed by me and considered in my medical decision making (see chart for details).     Patient presents with symptoms likely from a viral upper respiratory infection.  Do not suspect underlying cardiopulmonary process. Symptoms seem unlikely related to ACS, CHF or COPD exacerbations, pneumonia, pneumothorax. Patient is nontoxic appearing and not in need of emergent medical intervention.  Rapid strep and rapid flu negative.  Throat culture and COVID test pending.  Recommended symptom control with over the counter medications that are safe for patient.  Advised adequate fluid hydration and rest.  Return if symptoms fail  to improve in 1-2 weeks or you develop shortness of breath, chest pain, severe headache. Patient states  understanding and is agreeable.  Discharged with PCP followup.  Final Clinical Impressions(s) / UC Diagnoses   Final diagnoses:  Viral illness  Sore throat     Discharge Instructions      Rapid flu and rapid strep negative.  Throat culture and COVID test pending.  Suspect that you have a viral cause of your symptoms that should run its course as we discussed.  Ensure adequate fluid hydration and rest.  Follow-up if any symptoms persist or worsen.    ED Prescriptions   None    PDMP not reviewed this encounter.   Gustavus Bryant, [place] 06/12/23 [phone]

## 2023-06-13 LAB — SARS CORONAVIRUS 2 (TAT 6-24 HRS): SARS Coronavirus 2: NEGATIVE

## 2023-06-15 LAB — CULTURE, GROUP A STREP (THRC)

## 2023-07-10 ENCOUNTER — Ambulatory Visit: Payer: Medicaid Other | Admitting: Family Medicine

## 2023-07-17 ENCOUNTER — Encounter: Payer: Self-pay | Admitting: Family Medicine

## 2023-07-17 ENCOUNTER — Ambulatory Visit (INDEPENDENT_AMBULATORY_CARE_PROVIDER_SITE_OTHER): Payer: Medicaid Other | Admitting: Family Medicine

## 2023-07-17 VITALS — BP 115/73 | HR 74 | Temp 98.0°F | Resp 18 | Ht 62.0 in | Wt 157.2 lb

## 2023-07-17 DIAGNOSIS — F988 Other specified behavioral and emotional disorders with onset usually occurring in childhood and adolescence: Secondary | ICD-10-CM

## 2023-07-17 MED ORDER — BUPROPION HCL ER (XL) 150 MG PO TB24
150.0000 mg | ORAL_TABLET | Freq: Every day | ORAL | 1 refills | Status: DC
Start: 2023-07-17 — End: 2023-08-02

## 2023-07-17 NOTE — Progress Notes (Signed)
   Established Patient Office Visit  Subjective   Patient ID: Patricia Cole, female    DOB: 1996/09/02  Age: 27 y.o. MRN: [id number]  Chief Complaint  Patient presents with   ADHD    HPI  ADD Pt is here for follow up of new medicine. Started on Strattera 18mg  on Sept 30th. Pt reports the Strattera caused anxiousness, palpitations, and she felt lost. She took the medicine for 7 days sporadically. She is in class now and starting school in January 2025. She has a hard time focusing on her tasks. She would like to try another medicine to help with her attention.   Review of Systems  All other systems reviewed and are negative.    Objective:     BP 115/73   Pulse 74   Temp 98 F (36.7 C) (Oral)   Resp 18   Ht 5\' 2"  (1.575 m)   Wt 157 lb 3.2 oz (71.3 kg)   SpO2 99%   BMI 28.75 kg/m  BP Readings from Last 3 Encounters:  07/17/23 115/73  06/12/23 117/75  06/12/23 107/71      Physical Exam Vitals and nursing note reviewed.  Constitutional:      Appearance: Normal appearance. She is normal weight.  HENT:     Head: Normocephalic and atraumatic.     Right Ear: External ear normal.     Left Ear: External ear normal.     Nose: Nose normal.     Mouth/Throat:     Mouth: Mucous membranes are moist.     Pharynx: Oropharynx is clear.  Eyes:     Conjunctiva/sclera: Conjunctivae normal.     Pupils: Pupils are equal, round, and reactive to light.  Cardiovascular:     Rate and Rhythm: Normal rate.  Pulmonary:     Effort: Pulmonary effort is normal.  Abdominal:     General: Abdomen is flat. Bowel sounds are normal.  Skin:    General: Skin is warm.     Capillary Refill: Capillary refill takes less than 2 seconds.  Neurological:     General: No focal deficit present.     Mental Status: She is alert and oriented to person, place, and time. Mental status is at baseline.  Psychiatric:        Mood and Affect: Mood normal.        Behavior: Behavior normal.        Thought  Content: Thought content normal.        Judgment: Judgment normal.    No results found for any visits on 07/17/23.     The ASCVD Risk score (Arnett DK, et al., 2019) failed to calculate for the following reasons:   The 2019 ASCVD risk score is only valid for ages 73 to 55    Assessment & Plan:   Problem List Items Addressed This Visit   None Visit Diagnoses     Attention deficit disorder (ADD) without hyperactivity    -  Primary   Relevant Medications   buPROPion (WELLBUTRIN XL) 150 MG 24 hr tablet     No benefit from Strattera and had side effects from this treatment. Have discontinued this medicine and will start Wellbutrin 150mg  daily.  To follow up in 4 weeks.   Return in about 4 weeks (around 08/14/2023).    Suzan Slick, MD

## 2023-07-24 ENCOUNTER — Encounter: Payer: Medicaid Other | Admitting: Family Medicine

## 2023-07-27 ENCOUNTER — Encounter: Payer: Medicaid Other | Admitting: Family Medicine

## 2023-08-02 ENCOUNTER — Encounter: Payer: Self-pay | Admitting: Family Medicine

## 2023-08-02 DIAGNOSIS — F988 Other specified behavioral and emotional disorders with onset usually occurring in childhood and adolescence: Secondary | ICD-10-CM

## 2023-08-02 MED ORDER — LISDEXAMFETAMINE DIMESYLATE 20 MG PO CAPS
20.0000 mg | ORAL_CAPSULE | Freq: Every day | ORAL | 0 refills | Status: DC
Start: 2023-08-02 — End: 2023-10-16

## 2023-08-02 NOTE — Addendum Note (Signed)
Addended by: Suzan Slick on: 08/02/2023 09:28 AM   Modules accepted: Orders

## 2023-08-02 NOTE — Addendum Note (Signed)
Addended by: Suzan Slick on: 08/02/2023 09:11 AM   Modules accepted: Orders

## 2023-08-03 ENCOUNTER — Encounter (HOSPITAL_BASED_OUTPATIENT_CLINIC_OR_DEPARTMENT_OTHER): Payer: Self-pay | Admitting: Family Medicine

## 2023-08-03 ENCOUNTER — Encounter: Payer: Self-pay | Admitting: Family Medicine

## 2023-08-03 ENCOUNTER — Ambulatory Visit (INDEPENDENT_AMBULATORY_CARE_PROVIDER_SITE_OTHER): Payer: Medicaid Other | Admitting: Family Medicine

## 2023-08-03 VITALS — BP 127/80 | HR 67 | Temp 97.8°F | Resp 18 | Ht 62.0 in | Wt 160.1 lb

## 2023-08-03 DIAGNOSIS — Z23 Encounter for immunization: Secondary | ICD-10-CM

## 2023-08-03 DIAGNOSIS — R7302 Impaired glucose tolerance (oral): Secondary | ICD-10-CM | POA: Diagnosis not present

## 2023-08-03 DIAGNOSIS — Z136 Encounter for screening for cardiovascular disorders: Secondary | ICD-10-CM

## 2023-08-03 DIAGNOSIS — Z111 Encounter for screening for respiratory tuberculosis: Secondary | ICD-10-CM

## 2023-08-03 DIAGNOSIS — Z0000 Encounter for general adult medical examination without abnormal findings: Secondary | ICD-10-CM | POA: Diagnosis not present

## 2023-08-03 DIAGNOSIS — Z13220 Encounter for screening for lipoid disorders: Secondary | ICD-10-CM

## 2023-08-03 NOTE — Progress Notes (Signed)
Complete physical exam  Patient: Patricia Cole   DOB: 1995/10/05   27 y.o. Female  MRN: [id number]  Subjective:    Chief Complaint  Patient presents with   Annual Exam    Patient is here for her annual physical she will not be getting a pap due to being on her cycle today    Patricia Cole is a 27 y.o. female who presents today for a complete physical exam. She reports consuming a general diet.  Gym 3 times a week  She generally feels well. She reports sleeping well. She does not have additional problems to discuss today.  Pt has school form needing to be completed She will need TB blood test and updated Tdap today.  Most recent fall risk assessment:    06/12/2023    8:36 AM  Fall Risk   Falls in the past year? 0  Number falls in past yr: 0  Injury with Fall? 0  Risk for fall due to : No Fall Risks  Follow up Falls evaluation completed     Most recent depression screenings:    06/12/2023    8:37 AM 11/07/2022    4:53 PM  PHQ 2/9 Scores  PHQ - 2 Score 0 0  PHQ- 9 Score 0 0    Vision:Within last year  Patient Active Problem List   Diagnosis Date Noted   Hyperstimulation of ovaries 02/02/2023   Obesity (BMI 30-39.9) 12/01/2022   Overweight 11/12/2021   Sterilization consult 09/27/2021   Hemorrhoids 12/10/2020   Past Medical History:  Diagnosis Date   Anal fissure 12/10/2020   Hemorrhoid    Seasonal allergies    Past Surgical History:  Procedure Laterality Date   WISDOM TOOTH EXTRACTION     Social History   Socioeconomic History   Marital status: Married    Spouse name: Not on file   Number of children: Not on file   Years of education: Not on file   Highest education level: Not on file  Occupational History   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[employer] [place]  Tobacco Use   Smoking status: Never    Passive exposure: Never   Smokeless tobacco: Never  Vaping Use   Vaping status: Never Used  Substance and Sexual Activity   Alcohol use: Yes     Comment: occ   Drug use: Never   Sexual activity: Yes    Birth control/protection: None  Other Topics Concern   Not on file  Social History Narrative   Not on file   Social Determinants of Health   Financial Resource Strain: Low Risk  (04/23/2018)   Overall Financial Resource Strain (CARDIA)    Difficulty of Paying Living Expenses: Not very hard  Food Insecurity: No Food Insecurity (04/23/2018)   Hunger Vital Sign    Worried About Running Out of Food in the Last Year: Never true    Ran Out of Food in the Last Year: Never true  Transportation Needs: Unknown (04/23/2018)   PRAPARE - Transportation    Lack of Transportation (Medical): Patient declined    Lack of Transportation (Non-Medical): Patient declined  Physical Activity: Inactive (04/23/2018)   Exercise Vital Sign    Days of Exercise per Week: 0 days    Minutes of Exercise per Session: 0 min  Stress: No Stress Concern Present (04/23/2018)   [employer] of Occupational Health - Occupational Stress Questionnaire    Feeling of Stress : Only a little  Social Connections: Unknown (  01/22/2022)   Received from [facility]   Social Network    Social Network: Not on file  Intimate Partner Violence: Unknown (12/15/2021)   Received from [facility], [facility]   HITS    Physically Hurt: Not on file    Insult or Talk Down To: Not on file    Threaten Physical Harm: Not on file    Scream or Curse: Not on file   Family Status  Relation Name Status   Mother  Alive   Father  Alive   MGM  (Not Specified)   PGM  (Not Specified)  No partnership data on file   Allergies  Allergen Reactions   Morphine Hives, Itching and Shortness Of Breath   Other Rash   Tape Rash   Wound Dressing Adhesive Rash   Metformin Other (See Comments)   Phenergan [Promethazine] Hives   Wellbutrin [Bupropion] Other (See Comments)    Mood changes and irritability   Strattera [Atomoxetine] Anxiety      Patient Care  Team: Suzan Slick, MD as PCP - General (Family Medicine)   Outpatient Medications Prior to Visit  Medication Sig   lisdexamfetamine (VYVANSE) 20 MG capsule Take 1 capsule (20 mg total) by mouth daily.   No facility-administered medications prior to visit.    Review of Systems  All other systems reviewed and are negative.        Objective:     BP 127/80   Pulse 67   Temp 97.8 F (36.6 C) (Oral)   Resp 18   Ht 5\' 2"  (1.575 m)   Wt 160 lb 1.6 oz (72.6 kg)   LMP 08/03/2023 (Exact Date)   SpO2 99%   BMI 29.28 kg/m  BP Readings from Last 3 Encounters:  08/03/23 127/80  07/17/23 115/73  06/12/23 117/75      Physical Exam Vitals and nursing note reviewed.  Constitutional:      Appearance: Normal appearance. She is normal weight.  HENT:     Head: Normocephalic and atraumatic.     Right Ear: Tympanic membrane, ear canal and external ear normal.     Left Ear: Tympanic membrane, ear canal and external ear normal.     Nose: Nose normal.     Mouth/Throat:     Mouth: Mucous membranes are moist.     Pharynx: Oropharynx is clear.  Eyes:     Conjunctiva/sclera: Conjunctivae normal.     Pupils: Pupils are equal, round, and reactive to light.  Cardiovascular:     Rate and Rhythm: Normal rate and regular rhythm.     Pulses: Normal pulses.     Heart sounds: Normal heart sounds.  Pulmonary:     Effort: Pulmonary effort is normal.     Breath sounds: Normal breath sounds.  Abdominal:     General: Abdomen is flat. Bowel sounds are normal.  Skin:    General: Skin is warm.     Capillary Refill: Capillary refill takes less than 2 seconds.  Neurological:     General: No focal deficit present.     Mental Status: She is alert and oriented to person, place, and time. Mental status is at baseline.  Psychiatric:        Mood and Affect: Mood normal.        Behavior: Behavior normal.        Thought Content: Thought content normal.        Judgment: Judgment normal.     No  results  found for any visits on 08/03/23. Last CBC Lab Results  Component Value Date   WBC 9.3 02/02/2023   HGB 13.0 02/02/2023   HCT 39.7 02/02/2023   MCV 86 02/02/2023   MCH 28.2 02/02/2023   RDW 13.2 02/02/2023   PLT 425 02/02/2023   Last metabolic panel Lab Results  Component Value Date   GLUCOSE 96 02/02/2023   NA 140 02/02/2023   K 4.4 02/02/2023   CL 102 02/02/2023   CO2 24 02/02/2023   BUN 10 02/02/2023   CREATININE 0.68 02/02/2023   EGFR 122 02/02/2023   CALCIUM 9.3 02/02/2023   PROT 7.6 02/02/2023   ALBUMIN 4.5 02/02/2023   LABGLOB 3.1 02/02/2023   AGRATIO 1.5 02/02/2023   BILITOT 0.2 02/02/2023   ALKPHOS 86 02/02/2023   AST 20 02/02/2023   ALT 15 02/02/2023   ANIONGAP 8 06/03/2021   Last lipids No results found for: "CHOL", "HDL", "LDLCALC", "LDLDIRECT", "TRIG", "CHOLHDL" Last hemoglobin A1c No results found for: "HGBA1C"      Assessment & Plan:    Routine Health Maintenance and Physical Exam  Immunization History  Administered Date(s) Administered   Adenovirus 10/04/2013   Influenza Split 09/12/2020   Influenza,inj,Quad PF,6+ Mos 05/25/2018   Influenza-Unspecified 06/26/2021, 07/12/2022, 07/03/2023   Meningococcal Conjugate 10/04/2013   PFIZER(Purple Top)SARS-COV-2 Vaccination 04/30/2020, 07/17/2020   PPD Test 10/04/2013   Tdap 10/04/2013    Health Maintenance  Topic Date Due   COVID-19 Vaccine (3 - 2023-24 season) 05/14/2023   Cervical Cancer Screening (Pap smear)  01/14/2024 (Originally 11/28/2022)   DTaP/Tdap/Td (2 - Td or Tdap) 10/05/2023   INFLUENZA VACCINE  Completed   Hepatitis C Screening  Completed   HIV Screening  Completed   HPV VACCINES  Discontinued    Discussed health benefits of physical activity, and encouraged her to engage in regular exercise appropriate for her age and condition.  Problem List Items Addressed This Visit   None  No follow-ups on file. Annual physical exam  Encounter for lipid screening for  cardiovascular disease -     Lipid panel  Impaired glucose tolerance -     CBC with Differential/Platelet -     Comprehensive metabolic panel -     Hemoglobin A1c  Screening-pulmonary TB -     QuantiFERON-TB Gold Plus  Need for Tdap vaccination -     Tdap vaccine greater than or equal to 7yo IM   Screening labs. Tdap today    Suzan Slick, MD

## 2023-08-07 ENCOUNTER — Encounter: Payer: Self-pay | Admitting: Family Medicine

## 2023-08-07 ENCOUNTER — Telehealth: Payer: Self-pay | Admitting: Family Medicine

## 2023-08-07 DIAGNOSIS — R7302 Impaired glucose tolerance (oral): Secondary | ICD-10-CM

## 2023-08-07 DIAGNOSIS — Z6829 Body mass index (BMI) 29.0-29.9, adult: Secondary | ICD-10-CM

## 2023-08-07 LAB — CBC WITH DIFFERENTIAL/PLATELET
Basophils Absolute: 0 10*3/uL (ref 0.0–0.2)
Basos: 0 %
EOS (ABSOLUTE): 0.1 10*3/uL (ref 0.0–0.4)
Eos: 2 %
Hematocrit: 36.9 % (ref 34.0–46.6)
Hemoglobin: 12.2 g/dL (ref 11.1–15.9)
Immature Grans (Abs): 0 10*3/uL (ref 0.0–0.1)
Immature Granulocytes: 0 %
Lymphocytes Absolute: 3 10*3/uL (ref 0.7–3.1)
Lymphs: 45 %
MCH: 29.4 pg (ref 26.6–33.0)
MCHC: 33.1 g/dL (ref 31.5–35.7)
MCV: 89 fL (ref 79–97)
Monocytes Absolute: 0.4 10*3/uL (ref 0.1–0.9)
Monocytes: 6 %
Neutrophils Absolute: 3.2 10*3/uL (ref 1.4–7.0)
Neutrophils: 47 %
Platelets: 412 10*3/uL (ref 150–450)
RBC: 4.15 x10E6/uL (ref 3.77–5.28)
RDW: 12.4 % (ref 11.7–15.4)
WBC: 6.7 10*3/uL (ref 3.4–10.8)

## 2023-08-07 LAB — QUANTIFERON-TB GOLD PLUS
QuantiFERON Mitogen Value: >10 [IU]/mL
QuantiFERON Nil Value: 0.06 [IU]/mL
QuantiFERON TB1 Ag Value: 0.08 [IU]/mL
QuantiFERON TB2 Ag Value: 0.07 [IU]/mL
QuantiFERON-TB Gold Plus: NEGATIVE

## 2023-08-07 LAB — LIPID PANEL
Chol/HDL Ratio: 3.3 {ratio} (ref 0.0–4.4)
Cholesterol, Total: 131 mg/dL (ref 100–199)
HDL: 40 mg/dL (ref >39)
LDL Chol Calc (NIH): 74 mg/dL (ref 0–99)
Triglycerides: 88 mg/dL (ref 0–149)
VLDL Cholesterol Cal: 17 mg/dL (ref 5–40)

## 2023-08-07 LAB — COMPREHENSIVE METABOLIC PANEL
ALT: 13 [IU]/L (ref 0–32)
AST: 13 [IU]/L (ref 0–40)
Albumin: 4.4 g/dL (ref 4.0–5.0)
Alkaline Phosphatase: 77 [IU]/L (ref 44–121)
BUN/Creatinine Ratio: 18 (ref 9–23)
BUN: 11 mg/dL (ref 6–20)
Bilirubin Total: 0.3 mg/dL (ref 0.0–1.2)
CO2: 22 mmol/L (ref 20–29)
Calcium: 9.5 mg/dL (ref 8.7–10.2)
Chloride: 102 mmol/L (ref 96–106)
Creatinine, Ser: 0.61 mg/dL (ref 0.57–1.00)
Globulin, Total: 2.8 g/dL (ref 1.5–4.5)
Glucose: 84 mg/dL (ref 70–99)
Potassium: 4.8 mmol/L (ref 3.5–5.2)
Sodium: 138 mmol/L (ref 134–144)
Total Protein: 7.2 g/dL (ref 6.0–8.5)
eGFR: 125 mL/min/{1.73_m2} (ref >59)

## 2023-08-07 LAB — HEMOGLOBIN A1C
Est. average glucose Bld gHb Est-mCnc: 117 mg/dL
Hgb A1c MFr Bld: 5.7 % — ABNORMAL HIGH (ref 4.8–5.6)

## 2023-08-07 NOTE — Telephone Encounter (Signed)
Patient brought school physical form in to be completed during her CPE. This has been completed and in box at nurse's desk. Please call pt and inform her she may pick up.

## 2023-08-25 MED ORDER — WEGOVY 0.25 MG/0.5ML ~~LOC~~ SOAJ
0.2500 mg | SUBCUTANEOUS | 0 refills | Status: DC
Start: 2023-08-25 — End: 2023-10-16

## 2023-08-25 NOTE — Addendum Note (Signed)
Addended by: Suzan Slick on: 08/25/2023 09:39 AM   Modules accepted: Orders

## 2023-10-10 ENCOUNTER — Encounter (INDEPENDENT_AMBULATORY_CARE_PROVIDER_SITE_OTHER): Payer: Self-pay

## 2023-10-16 ENCOUNTER — Ambulatory Visit: Payer: Medicaid Other | Admitting: Family Medicine

## 2023-10-16 ENCOUNTER — Encounter: Payer: Self-pay | Admitting: Family Medicine

## 2023-10-16 VITALS — BP 111/67 | HR 85 | Temp 98.2°F | Resp 18 | Ht 62.0 in | Wt 166.8 lb

## 2023-10-16 DIAGNOSIS — F988 Other specified behavioral and emotional disorders with onset usually occurring in childhood and adolescence: Secondary | ICD-10-CM | POA: Diagnosis not present

## 2023-10-16 DIAGNOSIS — R7302 Impaired glucose tolerance (oral): Secondary | ICD-10-CM | POA: Diagnosis not present

## 2023-10-16 DIAGNOSIS — E669 Obesity, unspecified: Secondary | ICD-10-CM | POA: Diagnosis not present

## 2023-10-16 DIAGNOSIS — Z124 Encounter for screening for malignant neoplasm of cervix: Secondary | ICD-10-CM | POA: Diagnosis not present

## 2023-10-16 MED ORDER — WEGOVY 0.25 MG/0.5ML ~~LOC~~ SOAJ
0.2500 mg | SUBCUTANEOUS | 0 refills | Status: DC
Start: 2023-10-16 — End: 2024-01-05

## 2023-10-16 NOTE — Progress Notes (Signed)
Established Patient Office Visit  Subjective   Patient ID: Patricia Cole, female    DOB: 07-24-1996  Age: 28 y.o. MRN: [id number]  Chief Complaint  Patient presents with   Gynecologic Exam   Medication Refill    Patient would like to discuss getting her medication Vyvanse 20 mg refilled    Gynecologic Exam  Medication Refill  Pap Pt is here for pap. She did her CPE in November but postponed her pap until today.   ADHD Started on Vyvanse 20mg . Did well with this and could tell a difference. She had to put school on hold so won't need this filled at this time.  Weight She was started on Wegovy last month. She reports she never received this medication. Would like this resent.   Review of Systems  All other systems reviewed and are negative.    Objective:     BP 111/67   Pulse 85   Temp 98.2 F (36.8 C) (Oral)   Resp 18   Ht 5\' 2"  (1.575 m)   Wt 166 lb 12.8 oz (75.7 kg)   LMP 10/03/2023 (Exact Date)   SpO2 100%   BMI 30.51 kg/m  BP Readings from Last 3 Encounters:  10/16/23 111/67  08/03/23 127/80  07/17/23 115/73      Physical Exam Vitals and nursing note reviewed. Exam conducted with a chaperone present.  Constitutional:      Appearance: Normal appearance. She is normal weight.  HENT:     Head: Normocephalic and atraumatic.     Right Ear: External ear normal.     Left Ear: External ear normal.     Nose: Nose normal.     Mouth/Throat:     Mouth: Mucous membranes are moist.     Pharynx: Oropharynx is clear.  Eyes:     Conjunctiva/sclera: Conjunctivae normal.     Pupils: Pupils are equal, round, and reactive to light.  Cardiovascular:     Rate and Rhythm: Normal rate.  Pulmonary:     Effort: Pulmonary effort is normal.  Genitourinary:    General: Normal vulva.     Rectum: Normal.  Skin:    General: Skin is warm.     Capillary Refill: Capillary refill takes less than 2 seconds.  Neurological:     General: No focal deficit present.      Mental Status: She is alert and oriented to person, place, and time. Mental status is at baseline.  Psychiatric:        Mood and Affect: Mood normal.        Behavior: Behavior normal.        Thought Content: Thought content normal.        Judgment: Judgment normal.    No results found for any visits on 10/16/23.     The ASCVD Risk score (Arnett DK, et al., 2019) failed to calculate for the following reasons:   The 2019 ASCVD risk score is only valid for ages 48 to 53    Assessment & Plan:   Problem List Items Addressed This Visit   None Visit Diagnoses       Screening for cervical cancer    -  Primary   Relevant Orders   Pap LB (liquid-based)     Obesity (BMI 30-39.9) -     WUJWJX; Inject 0.25 mg into the skin once a week.  Dispense: 2 mL; Refill: 0  Screening for cervical cancer -     Pap LB (liquid-based)  Attention deficit disorder (ADD) without hyperactivity  Impaired glucose tolerance -     Wegovy; Inject 0.25 mg into the skin once a week.  Dispense: 2 mL; Refill: 0   Pap today To resend Wegovy 0.25mg  weekly for weight management and prediabetes. To hold off on vyvanse 20mg  for now due to no longer attending school. To restart and let me know. Worked well for her attention while she was taking it.  No follow-ups on file.    Suzan Slick, MD

## 2023-10-20 ENCOUNTER — Encounter: Payer: Self-pay | Admitting: Family Medicine

## 2023-10-20 LAB — PAP LB (LIQUID-BASED): PAP Smear Comment: 0

## 2023-10-23 ENCOUNTER — Encounter: Payer: Self-pay | Admitting: Family Medicine

## 2023-11-23 NOTE — Telephone Encounter (Signed)
 Prior auth for: WEGOVY 0.25 Determination: Pending as of 11/23/23 Auth #: [id number] Valid from: n/a

## 2023-11-27 ENCOUNTER — Ambulatory Visit: Payer: Medicaid Other | Admitting: Family Medicine

## 2023-12-27 NOTE — Telephone Encounter (Signed)
 PA Case: [id number], Status: Approved, Coverage Starts on: 11/23/2023 12:00:00 AM, Coverage Ends on: 05/21/2024 12:00:00 AM.. Authorization Expiration Date: May 21, 2024.

## 2024-01-02 ENCOUNTER — Ambulatory Visit: Admitting: Family Medicine

## 2024-01-05 ENCOUNTER — Encounter: Payer: Self-pay | Admitting: Family Medicine

## 2024-01-05 ENCOUNTER — Ambulatory Visit: Admitting: Family Medicine

## 2024-01-05 VITALS — BP 96/64 | HR 97 | Temp 97.9°F | Resp 18 | Ht 62.0 in | Wt 170.4 lb

## 2024-01-05 DIAGNOSIS — L723 Sebaceous cyst: Secondary | ICD-10-CM

## 2024-01-05 DIAGNOSIS — E669 Obesity, unspecified: Secondary | ICD-10-CM

## 2024-01-05 DIAGNOSIS — R7302 Impaired glucose tolerance (oral): Secondary | ICD-10-CM | POA: Diagnosis not present

## 2024-01-05 MED ORDER — WEGOVY 0.25 MG/0.5ML ~~LOC~~ SOAJ
0.2500 mg | SUBCUTANEOUS | 0 refills | Status: DC
Start: 2024-01-05 — End: 2024-02-06

## 2024-01-05 NOTE — Progress Notes (Signed)
 Established Patient Office Visit  Subjective   Patient ID: Patricia Cole, female    DOB: 1995/11/20  Age: 28 y.o. MRN: [id number]  Chief Complaint  Patient presents with   Weight Check   Cyst    Patient states that for about 6 months she has noticed a cyst on her left side of her chest right above her breast, She states that when her cycle comes on it does become painful, red, hot to the touch, and the pain can be felt in her left arm pit. She states that when her cycle goes off the cyst goes down and doesn't hurt    HPI  Weight management Pt was started on Wegovy  0.25mg  weekly on 4/6. This is her 4th injection today. She was started on this due to obesity and prediabetes. Tolerating medicine well. No nausea or constipation. She was out of town at the time and wasn't able to diet and exercise. Her starting weight was 176 lbs down to 170.  Cyst Pt reports she noted a cyst on her anterior chest 6 months ago. She reports during her last cycle, she had pain to the cyst. When her period went off, the pain stopped. She has tried to pop it with no luck.    Review of Systems  Skin:        Cyst on left anterior chest, resolved now  All other systems reviewed and are negative.    Objective:     BP 96/64   Pulse 97   Temp 97.9 F (36.6 C) (Oral)   Resp 18   Ht 5\' 2"  (1.575 m)   Wt 170 lb 6.4 oz (77.3 kg)   SpO2 100%   BMI 31.17 kg/m  BP Readings from Last 3 Encounters:  01/05/24 96/64  10/16/23 111/67  08/03/23 127/80      Physical Exam Vitals and nursing note reviewed.  Constitutional:      Appearance: Normal appearance. She is normal weight.  HENT:     Head: Normocephalic and atraumatic.     Right Ear: External ear normal.     Left Ear: External ear normal.     Nose: Nose normal.     Mouth/Throat:     Mouth: Mucous membranes are moist.     Pharynx: Oropharynx is clear.  Eyes:     Conjunctiva/sclera: Conjunctivae normal.     Pupils: Pupils are equal, round,  and reactive to light.  Cardiovascular:     Rate and Rhythm: Normal rate.  Pulmonary:     Effort: Pulmonary effort is normal.  Skin:    General: Skin is warm.     Capillary Refill: Capillary refill takes less than 2 seconds.     Comments: Small subcutaneous area of induration to left anterior chest wall measuring 0.5cm.  Neurological:     General: No focal deficit present.     Mental Status: She is alert and oriented to person, place, and time. Mental status is at baseline.  Psychiatric:        Mood and Affect: Mood normal.        Behavior: Behavior normal.        Thought Content: Thought content normal.        Judgment: Judgment normal.    No results found for any visits on 01/05/24.  Last hemoglobin A1c Lab Results  Component Value Date   HGBA1C 5.7 (H) 08/03/2023      The ASCVD Risk score (Arnett DK, et al., 2019) failed  to calculate for the following reasons:   The 2019 ASCVD risk score is only valid for ages 22 to 71    Assessment & Plan:   Problem List Items Addressed This Visit   None Sebaceous cyst  Obesity (BMI 30-39.9) -     Wegovy ; Inject 0.25 mg into the skin once a week.  Dispense: 2 mL; Refill: 0  Impaired glucose tolerance -     Wegovy ; Inject 0.25 mg into the skin once a week.  Dispense: 2 mL; Refill: 0   Pt here today for weight management. Lost 6 lbs in the first month. Continue current dose of Wegovy  0.25mg  weekly and refilled today.  Evidence of sebaceous cyst to anterior chest. To monitor for now as it's healed. If returns, pt to follow up.  No follow-ups on file.    Manette Section, MD

## 2024-02-06 ENCOUNTER — Ambulatory Visit: Admitting: Family Medicine

## 2024-02-06 ENCOUNTER — Encounter: Payer: Self-pay | Admitting: Family Medicine

## 2024-02-06 DIAGNOSIS — R7302 Impaired glucose tolerance (oral): Secondary | ICD-10-CM | POA: Diagnosis not present

## 2024-02-06 DIAGNOSIS — E669 Obesity, unspecified: Secondary | ICD-10-CM | POA: Diagnosis not present

## 2024-02-06 MED ORDER — WEGOVY 0.25 MG/0.5ML ~~LOC~~ SOAJ
0.2500 mg | SUBCUTANEOUS | 2 refills | Status: AC
Start: 2024-02-06 — End: 2024-05-06

## 2024-02-06 NOTE — Progress Notes (Signed)
 Established Patient Office Visit  Subjective   Patient ID: Patricia Cole, female    DOB: 29-Jun-1996  Age: 28 y.o. MRN: [id number]  Chief Complaint  Patient presents with   Weight Check    Patient is here for a 4 week weight management visit.     HPI  Weight management Pt is here for weight management. Started Wegovy  0.25mg  weekly in March 2025. Down 5 lbs since last month. Tolerating medicine well. Not on birth control, not wanting another baby. Discussed with pt today on possible side effects including more fertile while on injection. Pt would like to stay on 0.25mg  weekly for now. Pt needs refills today.   Review of Systems  All other systems reviewed and are negative.    Objective:     BP 100/69   Pulse 83   Temp 98 F (36.7 C) (Oral)   Resp 18   Ht 5\' 2"  (1.575 m)   Wt 165 lb 1.6 oz (74.9 kg)   SpO2 100%   BMI 30.20 kg/m  BP Readings from Last 3 Encounters:  02/06/24 100/69  01/05/24 96/64  10/16/23 111/67      Physical Exam Vitals and nursing note reviewed.  Constitutional:      Appearance: Normal appearance. She is normal weight.  HENT:     Head: Normocephalic and atraumatic.     Right Ear: External ear normal.     Left Ear: External ear normal.     Nose: Nose normal.     Mouth/Throat:     Mouth: Mucous membranes are moist.     Pharynx: Oropharynx is clear.  Eyes:     Conjunctiva/sclera: Conjunctivae normal.     Pupils: Pupils are equal, round, and reactive to light.  Cardiovascular:     Rate and Rhythm: Normal rate and regular rhythm.     Pulses: Normal pulses.     Heart sounds: Normal heart sounds.  Pulmonary:     Effort: Pulmonary effort is normal.     Breath sounds: Normal breath sounds.  Abdominal:     General: Abdomen is flat. Bowel sounds are normal.  Skin:    General: Skin is warm.     Capillary Refill: Capillary refill takes less than 2 seconds.  Neurological:     General: No focal deficit present.     Mental Status: She is  alert and oriented to person, place, and time. Mental status is at baseline.  Psychiatric:        Mood and Affect: Mood normal.        Behavior: Behavior normal.        Thought Content: Thought content normal.        Judgment: Judgment normal.    No results found for any visits on 02/06/24.     The ASCVD Risk score (Arnett DK, et al., 2019) failed to calculate for the following reasons:   The 2019 ASCVD risk score is only valid for ages 70 to 6    Assessment & Plan:   Problem List Items Addressed This Visit   None  Obesity (BMI 30-39.9) -     Wegovy ; Inject 0.25 mg into the skin once a week.  Dispense: 2 mL; Refill: 2  Impaired glucose tolerance -     Wegovy ; Inject 0.25 mg into the skin once a week.  Dispense: 2 mL; Refill: 2   Tolerating Wegovy  0.25mg  weekly. Refilled today. Pt would like to stay on this dose.  To see back in  6 weeks.  No follow-ups on file.    Manette Section, MD

## 2024-02-20 ENCOUNTER — Ambulatory Visit: Admitting: Family Medicine

## 2024-02-22 ENCOUNTER — Ambulatory Visit (HOSPITAL_BASED_OUTPATIENT_CLINIC_OR_DEPARTMENT_OTHER)

## 2024-02-23 ENCOUNTER — Encounter: Payer: Self-pay | Admitting: Family Medicine

## 2024-02-23 DIAGNOSIS — E669 Obesity, unspecified: Secondary | ICD-10-CM

## 2024-02-23 DIAGNOSIS — R7302 Impaired glucose tolerance (oral): Secondary | ICD-10-CM

## 2024-02-26 MED ORDER — WEGOVY 0.25 MG/0.5ML ~~LOC~~ SOAJ
0.5000 mg | SUBCUTANEOUS | 2 refills | Status: DC
Start: 2024-02-26 — End: 2024-02-26

## 2024-02-26 MED ORDER — WEGOVY 0.5 MG/0.5ML ~~LOC~~ SOAJ: 0.5000 mg | SUBCUTANEOUS | 1 refills | Status: DC

## 2024-02-26 NOTE — Addendum Note (Signed)
 Addended by: Manette Section on: 02/26/2024 01:15 PM   Modules accepted: Orders

## 2024-03-19 ENCOUNTER — Ambulatory Visit: Admitting: Family Medicine

## 2024-03-25 ENCOUNTER — Ambulatory Visit: Admitting: Family Medicine

## 2024-03-28 ENCOUNTER — Ambulatory Visit: Admitting: Family Medicine

## 2024-03-28 ENCOUNTER — Encounter: Payer: Self-pay | Admitting: Family Medicine

## 2024-03-28 VITALS — BP 101/68 | HR 90 | Temp 98.2°F | Resp 18 | Ht 62.0 in | Wt 160.8 lb

## 2024-03-28 DIAGNOSIS — Z6829 Body mass index (BMI) 29.0-29.9, adult: Secondary | ICD-10-CM | POA: Diagnosis not present

## 2024-03-28 DIAGNOSIS — R7302 Impaired glucose tolerance (oral): Secondary | ICD-10-CM

## 2024-03-28 MED ORDER — WEGOVY 0.25 MG/0.5ML ~~LOC~~ SOAJ
0.2500 mg | SUBCUTANEOUS | 3 refills | Status: DC
Start: 2024-03-28 — End: 2024-05-27

## 2024-03-28 NOTE — Progress Notes (Signed)
   Established Patient Office Visit  Subjective   Patient ID: Patricia Cole, female    DOB: December 23, 1995  Age: 28 y.o. MRN: [id number]  Chief Complaint  Patient presents with   Follow-up    Patient is here for a follow up , she states that she is doing good on the Wegovy .    HPI  Weight management/Prediabetes Pt was increased on her Wegovy  from 0.25 mg to 0.5 mg weekly 4 weeks ago. She is down 5 lbs but reports she didn't tolerate the 0.5mg  weekly. She went back down to 0.25 mg weekly due to extreme nausea on the 0.5mg  weekly. Tolerating well. Would like this refilled .   Review of Systems  All other systems reviewed and are negative.     Objective:     BP 101/68   Pulse 90   Temp 98.2 F (36.8 C) (Oral)   Resp 18   Ht 5' 2 (1.575 m)   Wt 160 lb 12.8 oz (72.9 kg)   SpO2 97%   BMI 29.41 kg/m  BP Readings from Last 3 Encounters:  03/28/24 101/68  02/06/24 100/69  01/05/24 96/64      Physical Exam Vitals and nursing note reviewed.  Constitutional:      Appearance: Normal appearance. She is normal weight.  HENT:     Head: Normocephalic and atraumatic.     Right Ear: External ear normal.     Left Ear: External ear normal.     Nose: Nose normal.     Mouth/Throat:     Mouth: Mucous membranes are moist.     Pharynx: Oropharynx is clear.  Eyes:     Conjunctiva/sclera: Conjunctivae normal.     Pupils: Pupils are equal, round, and reactive to light.  Cardiovascular:     Rate and Rhythm: Normal rate.  Pulmonary:     Effort: Pulmonary effort is normal.  Abdominal:     General: Abdomen is flat. Bowel sounds are normal.  Skin:    General: Skin is warm.     Capillary Refill: Capillary refill takes less than 2 seconds.  Neurological:     General: No focal deficit present.     Mental Status: She is alert and oriented to person, place, and time. Mental status is at baseline.  Psychiatric:        Mood and Affect: Mood normal.        Behavior: Behavior normal.         Thought Content: Thought content normal.        Judgment: Judgment normal.     No results found for any visits on 03/28/24.     The ASCVD Risk score (Arnett DK, et al., 2019) failed to calculate for the following reasons:   The 2019 ASCVD risk score is only valid for ages 31 to 23    Assessment & Plan:   Problem List Items Addressed This Visit   None Impaired glucose tolerance -     Wegovy ; Inject 0.25 mg into the skin once a week.  Dispense: 2 mL; Refill: 3  BMI 29.0-29.9,adult   Pt didn't tolerate the [facility] 0.5mg  weekly due to side effects ie nausea. Would like to go back down to 0.25mg . sent rx into pharmacy.    No follow-ups on file.    Torrence CINDERELLA Barrier, MD

## 2024-05-07 ENCOUNTER — Ambulatory Visit: Admitting: Family Medicine

## 2024-05-27 ENCOUNTER — Telehealth: Admitting: Family Medicine

## 2024-05-27 ENCOUNTER — Encounter: Payer: Self-pay | Admitting: Family Medicine

## 2024-05-27 VITALS — Ht 62.0 in | Wt 156.0 lb

## 2024-05-27 DIAGNOSIS — F988 Other specified behavioral and emotional disorders with onset usually occurring in childhood and adolescence: Secondary | ICD-10-CM

## 2024-05-27 DIAGNOSIS — Z6828 Body mass index (BMI) 28.0-28.9, adult: Secondary | ICD-10-CM

## 2024-05-27 DIAGNOSIS — Z713 Dietary counseling and surveillance: Secondary | ICD-10-CM

## 2024-05-27 MED ORDER — LISDEXAMFETAMINE DIMESYLATE 20 MG PO CAPS: 20.0000 mg | ORAL_CAPSULE | Freq: Every day | ORAL | 0 refills | Status: DC

## 2024-05-27 NOTE — Progress Notes (Signed)
 Virtual Visit via Video Note  I connected with Patricia Cole on 05/27/24 at  1:10 PM EDT by a video enabled telemedicine application and verified that I am speaking with the correct person using two identifiers.  Location: Patient: Home in [place], [place] Provider: Office in [place]   I discussed the limitations of evaluation and management by telemedicine and the availability of in person appointments. The patient expressed understanding and agreed to proceed.  History of Present Illness: Pt here for weight management follow up. She reports she had to stop the Wegovy  a month ago due to upset stomach. She did a gut test and it showed her to cut gluten and processed sugars. She has done so and has lost 9 lbs since last visit.   She also has hx of ADHD. Been on summer break but has started back with her classes. She is requesting to go back on Vyvanse  20mg .    Observations/Objective: Physical Exam Vitals and nursing note reviewed.  Constitutional:      Appearance: Normal appearance. She is normal weight.  HENT:     Head: Normocephalic and atraumatic.     Right Ear: External ear normal.     Left Ear: External ear normal.     Nose: Nose normal.  Pulmonary:     Effort: Pulmonary effort is normal.  Skin:    Capillary Refill: Capillary refill takes less than 2 seconds.  Neurological:     General: No focal deficit present.     Mental Status: She is alert and oriented to person, place, and time. Mental status is at baseline.  Psychiatric:        Mood and Affect: Mood normal.        Behavior: Behavior normal.        Thought Content: Thought content normal.        Judgment: Judgment normal.      Assessment and Plan: Attention deficit disorder (ADD) without hyperactivity -     Lisdexamfetamine Dimesylate ; Take 1 capsule (20 mg total) by mouth daily.  Dispense: 30 capsule; Refill: 0  BMI 28.0-28.9,adult     Follow Up Instructions: Pt will continue weight management off  Wegovy  due to side effects. Has lost 9 lbs since diet changes. Pt with hx of ADHD on drug holiday but back in school and would like to restart. To send in Vyvanse  20mg  daily.     I discussed the assessment and treatment plan with the patient. The patient was provided an opportunity to ask questions and all were answered. The patient agreed with the plan and demonstrated an understanding of the instructions.   The patient was advised to call back or seek an in-person evaluation if the symptoms worsen or if the condition fails to improve as anticipated.  I provided 8 minutes of non-face-to-face time during this encounter.   Torrence CINDERELLA Barrier, MD

## 2024-05-28 ENCOUNTER — Encounter: Payer: Self-pay | Admitting: Family Medicine

## 2024-05-28 DIAGNOSIS — Z207 Contact with and (suspected) exposure to pediculosis, acariasis and other infestations: Secondary | ICD-10-CM

## 2024-05-28 MED ORDER — PERMETHRIN 5 % EX CREA: TOPICAL_CREAM | CUTANEOUS | 2 refills | Status: DC

## 2024-07-16 ENCOUNTER — Encounter: Payer: Self-pay | Admitting: Family Medicine

## 2024-07-29 ENCOUNTER — Encounter: Admitting: Family Medicine

## 2024-08-07 ENCOUNTER — Encounter: Payer: Self-pay | Admitting: Family Medicine

## 2024-08-07 ENCOUNTER — Ambulatory Visit (INDEPENDENT_AMBULATORY_CARE_PROVIDER_SITE_OTHER): Admitting: Family Medicine

## 2024-08-07 VITALS — BP 109/65 | HR 85 | Ht 62.0 in | Wt 160.0 lb

## 2024-08-07 DIAGNOSIS — Z0000 Encounter for general adult medical examination without abnormal findings: Secondary | ICD-10-CM | POA: Diagnosis not present

## 2024-08-07 DIAGNOSIS — Z13220 Encounter for screening for lipoid disorders: Secondary | ICD-10-CM

## 2024-08-07 DIAGNOSIS — Z130 Encounter for screening for diseases of the blood and blood-forming organs and certain disorders involving the immune mechanism: Secondary | ICD-10-CM

## 2024-08-07 DIAGNOSIS — Z136 Encounter for screening for cardiovascular disorders: Secondary | ICD-10-CM

## 2024-08-07 DIAGNOSIS — Z13228 Encounter for screening for other metabolic disorders: Secondary | ICD-10-CM

## 2024-08-07 DIAGNOSIS — Z111 Encounter for screening for respiratory tuberculosis: Secondary | ICD-10-CM | POA: Insufficient documentation

## 2024-08-07 NOTE — Progress Notes (Signed)
 Complete physical exam  Patient: Patricia Cole   DOB: 12-13-1995   28 y.o. 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 female who presents today for a complete physical exam. She reports consuming a gluten free  diet. Gym/ health club routine includes cardio and mod to heavy weightlifting. She generally feels well. She reports sleeping well. She does not have additional problems to discuss today.   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 0  Number falls in past yr: 0  Injury with Fall? 0  Risk for fall due to : No Fall Risks  Follow up Falls evaluation completed     Most recent depression screenings:    08/07/2024    8:41 AM 06/12/2023    8:37 AM  PHQ 2/9 Scores  PHQ - 2 Score 0 0  PHQ- 9 Score 0 0      Data saved with a previous flowsheet row definition    Vision:Within last year and Dental: No current dental problems and Receives regular dental care    Patient Care Team: Colette Torrence GRADE, MD as PCP - General (Family Medicine)   Outpatient Medications Prior to Visit  Medication Sig   lisdexamfetamine (VYVANSE ) 20 MG capsule Take 1 capsule (20 mg total) by mouth daily. (Patient taking differently: Take 1 capsule (20 mg total) by mouth daily.)   [DISCONTINUED] permethrin  (ELIMITE ) 5 % cream Apply cream from head to feet, leave on for 8 to 14 hours, then wash with soap/water, repeat application if living mites present 14 days after initial treatment   No facility-administered medications prior to visit.    ROS        Objective:     BP 109/65 (BP Location: Left Arm, Patient Position: Sitting, Cuff Size: Normal)   Pulse 85   Ht 5' 2 (1.575 m)   Wt 160 lb (72.6 kg)   SpO2 97%   BMI 29.26 kg/m     Physical Exam Vitals and nursing note reviewed.  Constitutional:      General: She is not in acute distress.    Appearance: Normal appearance.  HENT:     Right Ear: Tympanic membrane normal.     Left Ear: Tympanic membrane normal.     Nose: Nose normal.     Mouth/Throat:     Mouth: Mucous membranes are moist.     Pharynx: Oropharynx is clear.  Eyes:     Extraocular Movements: Extraocular movements intact.  Neck:     Thyroid: No thyroid tenderness.  Cardiovascular:     Rate and Rhythm: Normal rate and regular rhythm.     Pulses:          Radial pulses are 2+ on the right side and 2+ on the left side.     Heart sounds: Normal heart sounds, S1 normal and S2 normal.  Pulmonary:     Effort: Pulmonary effort is normal.     Breath sounds: Normal breath sounds.  Abdominal:     General: Bowel sounds are normal.     Palpations: Abdomen is soft.     Tenderness: There is no abdominal tenderness.  Musculoskeletal:        General: Normal range of motion.     Cervical back: Normal range of motion.     Right lower leg: No edema.     Left lower leg: No edema.  Lymphadenopathy:     Cervical:     Right cervical: No superficial cervical adenopathy.    Left cervical: No superficial cervical adenopathy.  Skin:    General: Skin is warm and dry.  Neurological:     General: No focal deficit present.     Mental Status: She is alert. Mental status is at baseline.  Psychiatric:        Mood and Affect: Mood normal.        Behavior: Behavior normal.        Thought Content: Thought content normal.        Judgment: Judgment normal.      No results found for any visits on 08/07/24.     Assessment & Plan:    Routine Health Maintenance and Physical Exam  Immunization History  Administered Date(s) Administered   Adenovirus 10/04/2013   Influenza Split 09/12/2020   Influenza,inj,Quad PF,6+ Mos 05/25/2018   Influenza-Unspecified 06/26/2021, 07/12/2022, 06/30/2023, 06/27/2024    Meningococcal Conjugate 10/04/2013   PFIZER(Purple Top)SARS-COV-2 Vaccination 04/30/2020, 07/17/2020   PPD Test 10/04/2013   Tdap 10/04/2013, 08/03/2023    Health Maintenance  Topic Date Due   Hepatitis B Vaccines 19-59 Average Risk (1 of 3 - 19+ 3-dose series) Never done   Cervical Cancer Screening (Pap smear)  10/15/2026   DTaP/Tdap/Td (3 - Td or Tdap) 08/02/2033   Influenza Vaccine  Completed   Hepatitis C Screening  Completed   HIV Screening  Completed   Pneumococcal Vaccine  Aged Out   Meningococcal B Vaccine  Aged Out   HPV VACCINES  Discontinued   COVID-19 Vaccine  Discontinued    Discussed health benefits of physical activity, and encouraged her to engage in regular exercise appropriate for her age and condition.  Problem List Items Addressed This Visit     Screening for deficiency anemia - Primary   Relevant Orders   CBC   Screening-pulmonary TB    Routine labs ordered.  HCM reviewed/discussed. Anticipatory guidance regarding healthy weight, lifestyle and choices given. Recommend healthy diet.  Recommend approximately 150 minutes/week of moderate intensity exercise. Resistance training is good for building muscles and for bone health. Muscle mass helps to increase our metabolism and to burn more calories at rest.  Limit alcohol consumption: no more than one drink per day for women and 2 drinks per day for me. Recommend regular dental and vision exams. Always use seatbelt/lap and shoulder restraints. Recommend using smoke alarms and checking batteries at least twice a year. Recommend using sunscreen when outside. Agrees with plan of care discussed.  Questions answered.      Return in about 1 year (around 08/08/2025) for CPE with labs.     Darice JONELLE Brownie, FNP

## 2024-09-11 ENCOUNTER — Ambulatory Visit: Payer: Self-pay | Admitting: Family Medicine

## 2024-09-11 LAB — LIPID PANEL
Chol/HDL Ratio: 3.4 ratio (ref 0.0–4.4)
Cholesterol, Total: 129 mg/dL (ref 100–199)
HDL: 38 mg/dL — ABNORMAL LOW
LDL Chol Calc (NIH): 76 mg/dL (ref 0–99)
Triglycerides: 73 mg/dL (ref 0–149)
VLDL Cholesterol Cal: 15 mg/dL (ref 5–40)

## 2024-09-11 LAB — COMPREHENSIVE METABOLIC PANEL WITH GFR
ALT: 10 IU/L (ref 0–32)
AST: 14 IU/L (ref 0–40)
Albumin: 4.4 g/dL (ref 4.0–5.0)
Alkaline Phosphatase: 67 IU/L (ref 41–116)
BUN/Creatinine Ratio: 17 (ref 9–23)
BUN: 11 mg/dL (ref 6–20)
Bilirubin Total: 0.6 mg/dL (ref 0.0–1.2)
CO2: 21 mmol/L (ref 20–29)
Calcium: 9.4 mg/dL (ref 8.7–10.2)
Chloride: 104 mmol/L (ref 96–106)
Creatinine, Ser: 0.64 mg/dL (ref 0.57–1.00)
Globulin, Total: 2.5 g/dL (ref 1.5–4.5)
Glucose: 89 mg/dL (ref 70–99)
Potassium: 4.7 mmol/L (ref 3.5–5.2)
Sodium: 139 mmol/L (ref 134–144)
Total Protein: 6.9 g/dL (ref 6.0–8.5)
eGFR: 123 mL/min/1.73

## 2024-09-11 LAB — TSH+FREE T4
Free T4: 1.14 ng/dL (ref 0.82–1.77)
TSH: 2.43 u[IU]/mL (ref 0.450–4.500)

## 2024-09-11 LAB — CBC
Hematocrit: 39 % (ref 34.0–46.6)
Hemoglobin: 12.4 g/dL (ref 11.1–15.9)
MCH: 28.4 pg (ref 26.6–33.0)
MCHC: 31.8 g/dL (ref 31.5–35.7)
MCV: 89 fL (ref 79–97)
Platelets: 372 x10E3/uL (ref 150–450)
RBC: 4.36 x10E6/uL (ref 3.77–5.28)
RDW: 13 % (ref 11.7–15.4)
WBC: 8.3 x10E3/uL (ref 3.4–10.8)

## 2024-09-11 LAB — HEMOGLOBIN A1C
Est. average glucose Bld gHb Est-mCnc: 120 mg/dL
Hgb A1c MFr Bld: 5.8 % — ABNORMAL HIGH (ref 4.8–5.6)

## 2024-09-26 ENCOUNTER — Encounter: Admitting: Family Medicine

## 2024-10-08 ENCOUNTER — Encounter: Payer: Self-pay | Admitting: Family Medicine

## 2024-10-10 ENCOUNTER — Encounter: Payer: Self-pay | Admitting: Family Medicine

## 2024-10-10 ENCOUNTER — Telehealth: Admitting: Family Medicine

## 2024-10-10 ENCOUNTER — Other Ambulatory Visit (HOSPITAL_COMMUNITY): Payer: Self-pay

## 2024-10-10 ENCOUNTER — Telehealth: Payer: Self-pay

## 2024-10-10 DIAGNOSIS — F988 Other specified behavioral and emotional disorders with onset usually occurring in childhood and adolescence: Secondary | ICD-10-CM

## 2024-10-10 MED ORDER — LISDEXAMFETAMINE DIMESYLATE 10 MG PO CAPS: 10.0000 mg | ORAL_CAPSULE | Freq: Every day | ORAL | 0 refills | Status: AC

## 2024-10-10 NOTE — Telephone Encounter (Signed)
 Pharmacy Patient Advocate Encounter  Insurance verification completed.   The patient is insured through [employer]   Ran test claim for lisdexamfetamine  (VYVANSE ) 10 MG capsule . Currently a quantity of 30 is a 30 day supply and the co-pay is 4.00 . The current [employer] VYVANSE  CALLED PHARMACY TO REPROCESS CLAIM.  No PA needed at this time.Patricia Cole

## 2024-10-10 NOTE — Progress Notes (Signed)
 Virtual Visit via Video Note  I connected with Patricia Cole on 10/10/24 at  8:30 AM EST by a video enabled telemedicine application and verified that I am speaking with the correct person using two identifiers.  Location: Patient: home in [place] Provider: Home in [place] ([place])   I discussed the limitations of evaluation and management by telemedicine and the availability of in person appointments. The patient expressed understanding and agreed to proceed.  History of Present Illness: Pt with hx of ADHD. She's been on Vyvanse  20mg  daily. She reports taking this 2-3 times a week at most when she has class. She says when she is taking it, she's having side effects which include feeling jittery. She would like to lower the dose to 10mg  to see if the side effect will go away. Outside of this, she says it's working well for her and keeping her focused on her school work.    Observations/Objective: Physical Exam Nursing note reviewed.  Constitutional:      Appearance: Normal appearance. She is normal weight.  HENT:     Head: Normocephalic and atraumatic.     Right Ear: External ear normal.     Left Ear: External ear normal.     Nose: Nose normal.  Eyes:     Extraocular Movements: Extraocular movements intact.  Pulmonary:     Effort: Pulmonary effort is normal.  Neurological:     General: No focal deficit present.     Mental Status: She is alert and oriented to person, place, and time. Mental status is at baseline.  Psychiatric:        Mood and Affect: Mood normal.        Behavior: Behavior normal.        Thought Content: Thought content normal.        Judgment: Judgment normal.     Assessment and Plan: Attention deficit disorder (ADD) without hyperactivity -     Lisdexamfetamine  Dimesylate; Take 1 capsule (10 mg total) by mouth daily.  Dispense: 30 capsule; Refill: 0     Follow Up Instructions: Pt with hx of ADHD. Having side effects from Vyvanse  20mg  (jittery). Would like to try  lower dose as she's only taking on days she has class which isn't daily. Will send in 10mg  and follow up.   I discussed the assessment and treatment plan with the patient. The patient was provided an opportunity to ask questions and all were answered. The patient agreed with the plan and demonstrated an understanding of the instructions.   The patient was advised to call back or seek an in-person evaluation if the symptoms worsen or if the condition fails to improve as anticipated.  I provided 8 minutes of non-face-to-face time during this encounter.   Torrence CINDERELLA Barrier, MD

## 2024-10-14 ENCOUNTER — Other Ambulatory Visit (HOSPITAL_COMMUNITY): Payer: Self-pay

## 2024-10-16 ENCOUNTER — Other Ambulatory Visit (HOSPITAL_COMMUNITY): Payer: Self-pay
